# Patient Record
Sex: Female | Born: 1980 | Race: Black or African American | Hispanic: No | Marital: Single | Smoking: Former smoker
Health system: Southern US, Community
[De-identification: ages and names within clinical notes are randomized; demographics above are authoritative.]

## PROBLEM LIST (undated history)

## (undated) DIAGNOSIS — J45909 Unspecified asthma, uncomplicated: Secondary | ICD-10-CM

## (undated) DIAGNOSIS — F419 Anxiety disorder, unspecified: Secondary | ICD-10-CM

## (undated) DIAGNOSIS — M1990 Unspecified osteoarthritis, unspecified site: Secondary | ICD-10-CM

## (undated) DIAGNOSIS — T7840XA Allergy, unspecified, initial encounter: Secondary | ICD-10-CM

## (undated) DIAGNOSIS — F32A Depression, unspecified: Secondary | ICD-10-CM

## (undated) HISTORY — DX: Anxiety disorder, unspecified: F41.9

## (undated) HISTORY — PX: ARM SKIN LESION BIOPSY / EXCISION: SUR471

## (undated) HISTORY — DX: Allergy, unspecified, initial encounter: T78.40XA

## (undated) HISTORY — DX: Depression, unspecified: F32.A

## (undated) HISTORY — DX: Unspecified osteoarthritis, unspecified site: M19.90

---

## 2000-01-26 ENCOUNTER — Emergency Department (HOSPITAL_COMMUNITY): Admission: EM | Admit: 2000-01-26 | Discharge: 2000-01-26 | Payer: Self-pay | Admitting: Emergency Medicine

## 2000-01-26 ENCOUNTER — Encounter: Payer: Self-pay | Admitting: Emergency Medicine

## 2003-10-15 ENCOUNTER — Emergency Department (HOSPITAL_COMMUNITY): Admission: EM | Admit: 2003-10-15 | Discharge: 2003-10-15 | Payer: Self-pay | Admitting: Emergency Medicine

## 2004-07-12 ENCOUNTER — Emergency Department (HOSPITAL_COMMUNITY): Admission: EM | Admit: 2004-07-12 | Discharge: 2004-07-12 | Payer: Self-pay | Admitting: Emergency Medicine

## 2006-06-29 ENCOUNTER — Emergency Department (HOSPITAL_COMMUNITY): Admission: EM | Admit: 2006-06-29 | Discharge: 2006-06-29 | Payer: Self-pay | Admitting: Emergency Medicine

## 2006-08-08 ENCOUNTER — Emergency Department (HOSPITAL_COMMUNITY): Admission: EM | Admit: 2006-08-08 | Discharge: 2006-08-08 | Payer: Self-pay | Admitting: Emergency Medicine

## 2006-08-23 ENCOUNTER — Emergency Department (HOSPITAL_COMMUNITY): Admission: EM | Admit: 2006-08-23 | Discharge: 2006-08-23 | Payer: Self-pay | Admitting: Emergency Medicine

## 2006-10-06 ENCOUNTER — Emergency Department (HOSPITAL_COMMUNITY): Admission: EM | Admit: 2006-10-06 | Discharge: 2006-10-06 | Payer: Self-pay | Admitting: Emergency Medicine

## 2007-07-26 ENCOUNTER — Emergency Department (HOSPITAL_COMMUNITY): Admission: EM | Admit: 2007-07-26 | Discharge: 2007-07-26 | Payer: Self-pay | Admitting: Emergency Medicine

## 2008-10-29 ENCOUNTER — Emergency Department (HOSPITAL_BASED_OUTPATIENT_CLINIC_OR_DEPARTMENT_OTHER): Admission: EM | Admit: 2008-10-29 | Discharge: 2008-10-30 | Payer: Self-pay | Admitting: Emergency Medicine

## 2008-10-29 ENCOUNTER — Ambulatory Visit: Payer: Self-pay | Admitting: Diagnostic Radiology

## 2009-05-21 ENCOUNTER — Emergency Department (HOSPITAL_BASED_OUTPATIENT_CLINIC_OR_DEPARTMENT_OTHER): Admission: EM | Admit: 2009-05-21 | Discharge: 2009-05-21 | Payer: Self-pay | Admitting: Emergency Medicine

## 2010-03-27 ENCOUNTER — Emergency Department (HOSPITAL_BASED_OUTPATIENT_CLINIC_OR_DEPARTMENT_OTHER)
Admission: EM | Admit: 2010-03-27 | Discharge: 2010-03-27 | Disposition: A | Discharge status: Home / Self Care | Payer: No Typology Code available for payment source | Attending: Emergency Medicine | Admitting: Emergency Medicine

## 2010-03-27 DIAGNOSIS — J45909 Unspecified asthma, uncomplicated: Secondary | ICD-10-CM | POA: Insufficient documentation

## 2010-03-27 DIAGNOSIS — F341 Dysthymic disorder: Secondary | ICD-10-CM | POA: Insufficient documentation

## 2010-03-27 DIAGNOSIS — R112 Nausea with vomiting, unspecified: Secondary | ICD-10-CM | POA: Insufficient documentation

## 2010-03-27 DIAGNOSIS — R197 Diarrhea, unspecified: Secondary | ICD-10-CM | POA: Insufficient documentation

## 2010-03-27 DIAGNOSIS — F17200 Nicotine dependence, unspecified, uncomplicated: Secondary | ICD-10-CM | POA: Insufficient documentation

## 2010-03-27 LAB — URINALYSIS, ROUTINE W REFLEX MICROSCOPIC
Bilirubin Urine: NEGATIVE
Hgb urine dipstick: NEGATIVE
Ketones, ur: NEGATIVE mg/dL
Nitrite: NEGATIVE
Protein, ur: NEGATIVE mg/dL
Specific Gravity, Urine: 1.012 (ref 1.005–1.030)
Urine Glucose, Fasting: NEGATIVE mg/dL
Urobilinogen, UA: 1 mg/dL (ref 0.0–1.0)
pH: 7.5 (ref 5.0–8.0)

## 2010-03-27 LAB — BASIC METABOLIC PANEL
BUN: 9 mg/dL (ref 6–23)
CO2: 28 mEq/L (ref 19–32)
Calcium: 8.8 mg/dL (ref 8.4–10.5)
Chloride: 108 mEq/L (ref 96–112)
Creatinine, Ser: 0.6 mg/dL (ref 0.4–1.2)
GFR calc Af Amer: >60 mL/min (ref >60)
GFR calc non Af Amer: >60 mL/min (ref >60)
Glucose, Bld: 84 mg/dL (ref 70–99)
Potassium: 4.2 mEq/L (ref 3.5–5.1)
Sodium: 141 mEq/L (ref 135–145)

## 2010-03-27 LAB — PREGNANCY, URINE: Preg Test, Ur: NEGATIVE

## 2010-04-30 ENCOUNTER — Emergency Department (HOSPITAL_BASED_OUTPATIENT_CLINIC_OR_DEPARTMENT_OTHER)
Admission: EM | Admit: 2010-04-30 | Discharge: 2010-04-30 | Disposition: A | Discharge status: Home / Self Care | Payer: No Typology Code available for payment source | Attending: Emergency Medicine | Admitting: Emergency Medicine

## 2010-04-30 DIAGNOSIS — F17200 Nicotine dependence, unspecified, uncomplicated: Secondary | ICD-10-CM | POA: Insufficient documentation

## 2010-04-30 DIAGNOSIS — J45909 Unspecified asthma, uncomplicated: Secondary | ICD-10-CM | POA: Insufficient documentation

## 2010-04-30 DIAGNOSIS — Z79899 Other long term (current) drug therapy: Secondary | ICD-10-CM | POA: Insufficient documentation

## 2010-04-30 DIAGNOSIS — F341 Dysthymic disorder: Secondary | ICD-10-CM | POA: Insufficient documentation

## 2010-04-30 DIAGNOSIS — R109 Unspecified abdominal pain: Secondary | ICD-10-CM | POA: Insufficient documentation

## 2010-04-30 LAB — URINALYSIS, ROUTINE W REFLEX MICROSCOPIC
Hgb urine dipstick: NEGATIVE
Protein, ur: NEGATIVE mg/dL
Urobilinogen, UA: 0.2 mg/dL (ref 0.0–1.0)

## 2010-04-30 LAB — WET PREP, GENITAL
Clue Cells Wet Prep HPF POC: NONE SEEN
Trich, Wet Prep: NONE SEEN

## 2010-05-01 LAB — GC/CHLAMYDIA PROBE AMP, GENITAL: GC Probe Amp, Genital: NEGATIVE

## 2010-11-25 LAB — BASIC METABOLIC PANEL
BUN: 6
Chloride: 109
GFR calc non Af Amer: >60
Glucose, Bld: 96
Potassium: 3.9

## 2010-11-25 LAB — DIFFERENTIAL
Basophils Absolute: 0.1
Basophils Relative: 2 — ABNORMAL HIGH
Eosinophils Absolute: 0
Eosinophils Relative: 1

## 2010-11-25 LAB — CBC
HCT: 35.9 — ABNORMAL LOW
MCV: 94.9
Platelets: 249
RDW: 13.5

## 2011-03-14 IMAGING — CR DG HAND COMPLETE 3+V*R*
3 series · 3 of 3 positions shown · non-contrast
Comparison: None

CLINICAL DATA: Status post dog bite to right hand, with swelling
and puncture wound between first and second metacarpals.

RIGHT HAND - COMPLETE 3+ VIEW

[x hand pa right]
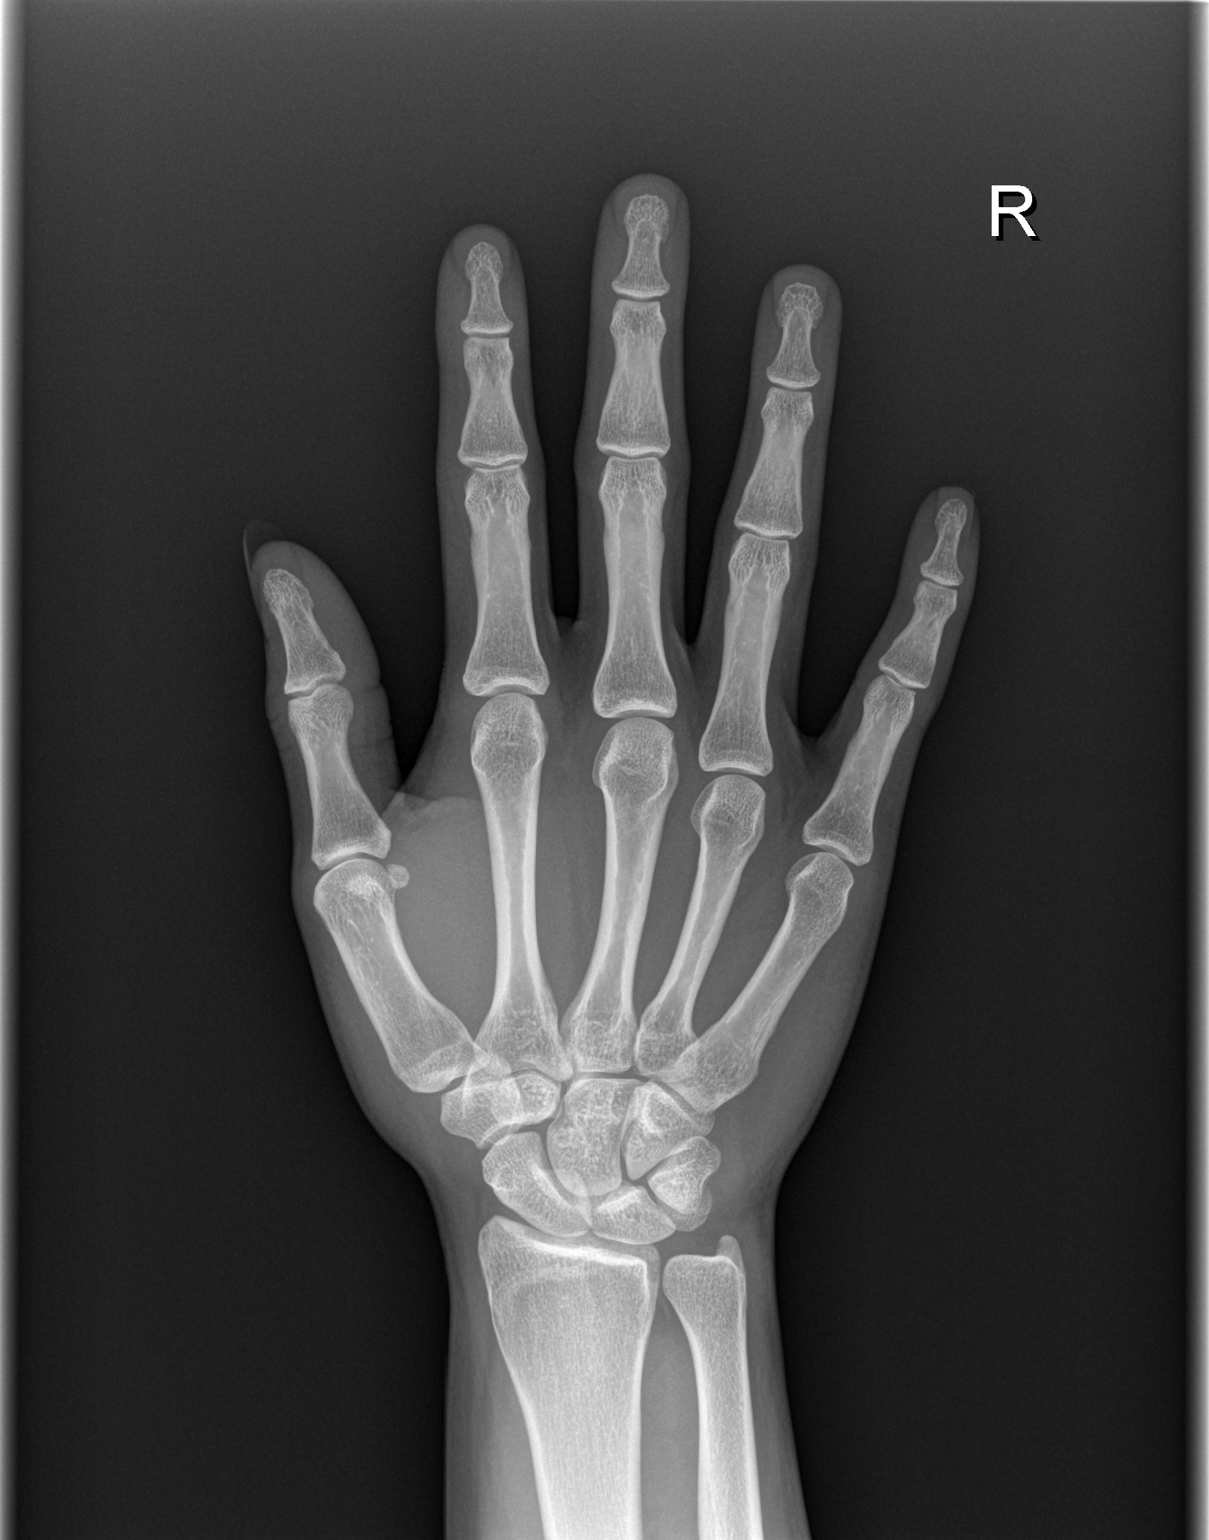

[x hand oblique right]
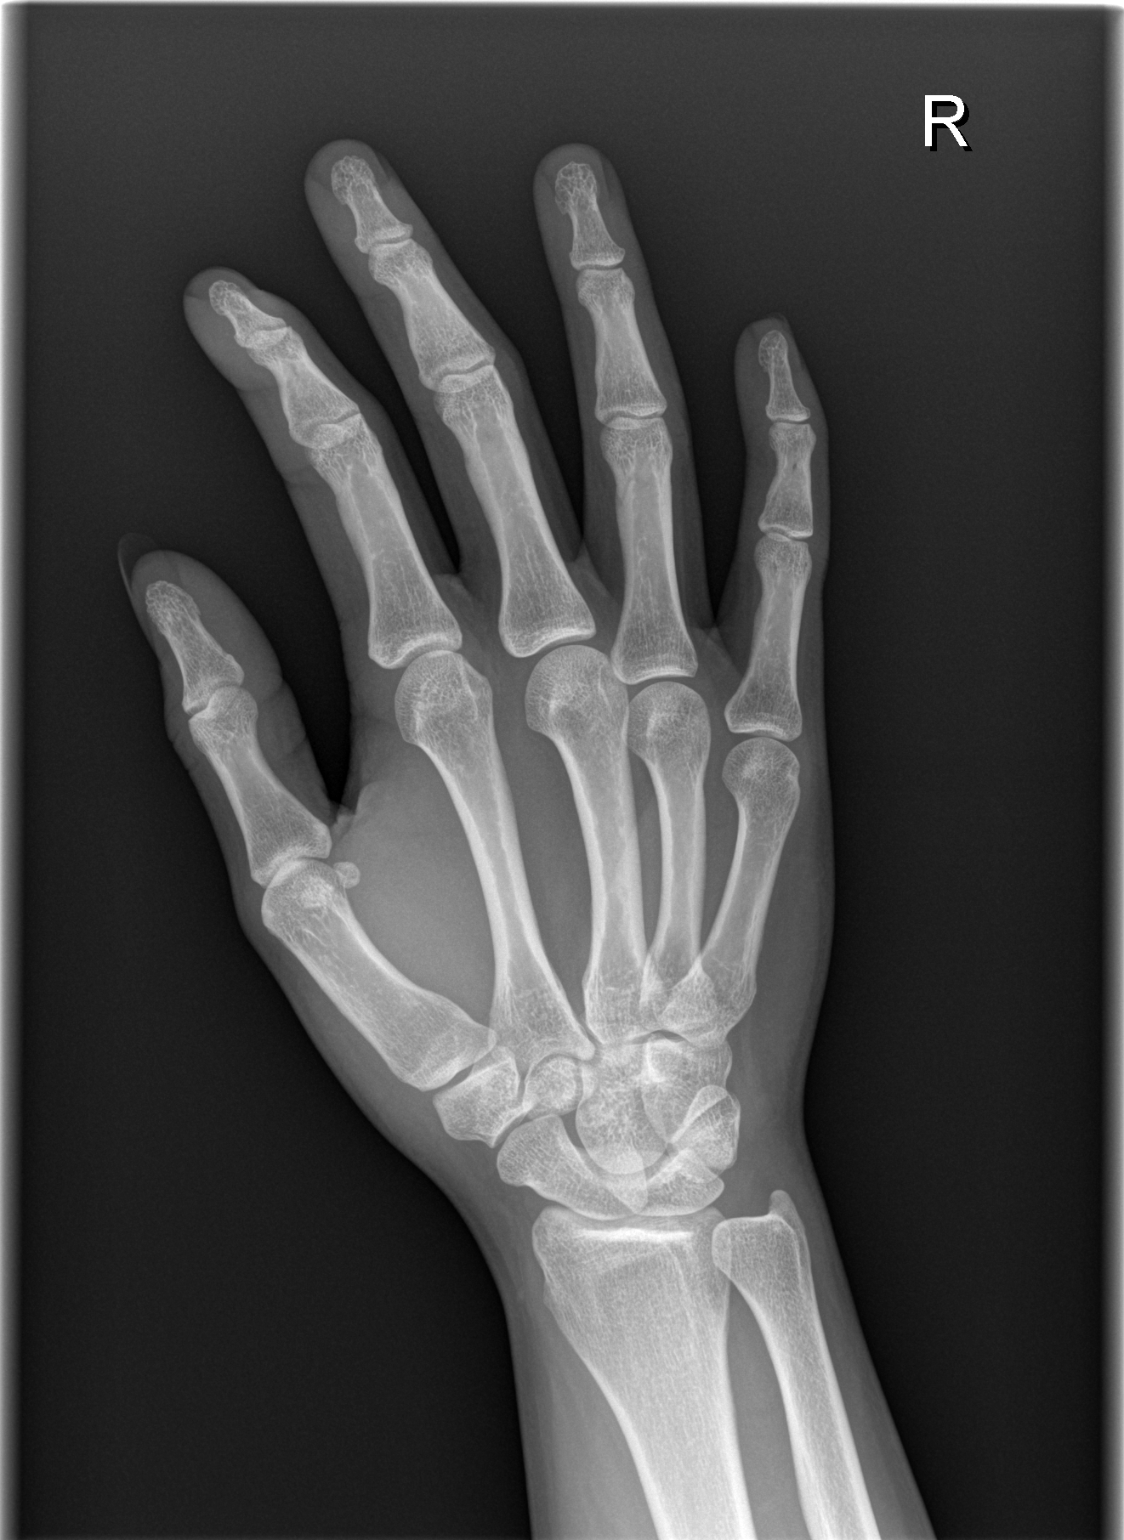

[x hand lat right]
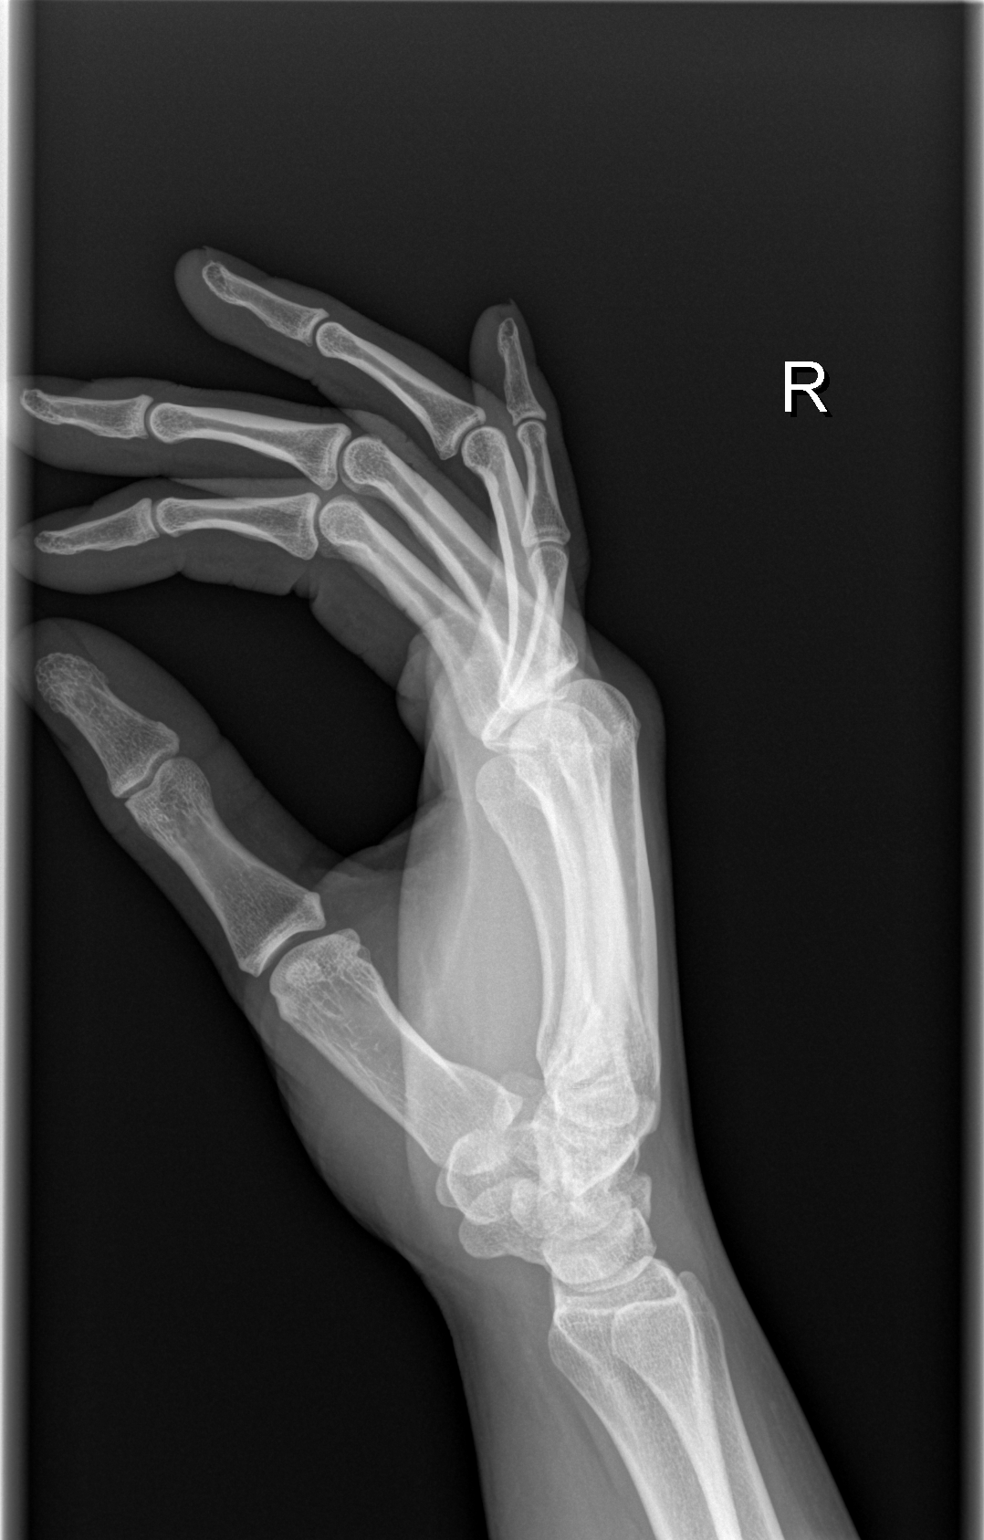

[3 of 3 positions shown; findings below may reference images not displayed]

FINDINGS: There is no evidence of fracture or dislocation.  The
joint spaces are preserved; no definite soft tissue abnormalities
are seen on radiograph.  The carpal rows are intact, and
demonstrate normal alignment.
IMPRESSION: No evidence of fracture or dislocation.

## 2012-01-15 ENCOUNTER — Encounter (HOSPITAL_BASED_OUTPATIENT_CLINIC_OR_DEPARTMENT_OTHER): Payer: Self-pay

## 2012-01-15 ENCOUNTER — Emergency Department (HOSPITAL_BASED_OUTPATIENT_CLINIC_OR_DEPARTMENT_OTHER)
Admission: EM | Admit: 2012-01-15 | Discharge: 2012-01-15 | Disposition: A | Discharge status: Home / Self Care | Payer: Self-pay | Attending: Emergency Medicine | Admitting: Emergency Medicine

## 2012-01-15 DIAGNOSIS — R0982 Postnasal drip: Secondary | ICD-10-CM | POA: Insufficient documentation

## 2012-01-15 DIAGNOSIS — J029 Acute pharyngitis, unspecified: Secondary | ICD-10-CM

## 2012-01-15 DIAGNOSIS — R059 Cough, unspecified: Secondary | ICD-10-CM | POA: Insufficient documentation

## 2012-01-15 DIAGNOSIS — R509 Fever, unspecified: Secondary | ICD-10-CM | POA: Insufficient documentation

## 2012-01-15 DIAGNOSIS — R05 Cough: Secondary | ICD-10-CM | POA: Insufficient documentation

## 2012-01-15 HISTORY — DX: Unspecified asthma, uncomplicated: J45.909

## 2012-01-15 LAB — RAPID STREP SCREEN (MED CTR MEBANE ONLY): Streptococcus, Group A Screen (Direct): NEGATIVE

## 2012-01-15 NOTE — ED Provider Notes (Signed)
History     CSN: [id number]  Arrival date & time 01/15/12  0808   First MD Initiated Contact with Patient 01/15/12 [phone]      No chief complaint on file.   (Consider location/radiation/quality/duration/timing/severity/associated sxs/prior treatment) HPI  Patient complaining of sore throat, fever, chills 8 days ago.  Some sneezing and cough with post nasal drip.  Mucinex, cold and flu alka seltzer treatment at home.  Fever for a day or two.  Today throat hurting worse all over again.  Pain is moderate no treatment today.  No sick contacts.     No past medical history on file.  No past surgical history on file.  No family history on file.  History  Substance Use Topics  . Smoking status: Not on file  . Smokeless tobacco: Not on file  . Alcohol Use: Not on file    OB History    No data available      Review of Systems  Constitutional: Positive for fever.  HENT: Positive for congestion, sneezing and postnasal drip.   Respiratory: Positive for cough. Negative for wheezing.   Cardiovascular: Negative for chest pain and leg swelling.  Gastrointestinal: Negative for vomiting.  Genitourinary: Negative.   Musculoskeletal: Negative.   Neurological: Negative.   Hematological: Negative.   Psychiatric/Behavioral: Negative.     Allergies  Review of patient's allergies indicates not on file.  Home Medications  No current outpatient prescriptions on file.  There were no vitals taken for this visit.  Physical Exam  Nursing note and vitals reviewed. Constitutional: She is oriented to person, place, and time. She appears well-developed and well-nourished.  HENT:  Head: Normocephalic and atraumatic.  Right Ear: External ear normal.  Left Ear: External ear normal.  Nose: Nose normal.       Some punctate white spots left tonsil, no swelling or erythema beyond baseline.   Eyes: Conjunctivae normal and EOM are normal. Pupils are equal, round, and reactive to light.  Neck:  Normal range of motion. Neck supple.  Cardiovascular: Normal rate and regular rhythm.   Pulmonary/Chest: Effort normal and breath sounds normal.  Abdominal: Soft. Bowel sounds are normal.  Musculoskeletal: Normal range of motion.  Neurological: She is alert and oriented to person, place, and time.  Skin: Skin is warm and dry.  Psychiatric: She has a normal mood and affect. Her behavior is normal. Judgment and thought content normal.    ED Course  Procedures (including critical care time)  Labs Reviewed - No data to display No results found.   No diagnosis found.   Results for orders placed during the hospital encounter of 01/15/12  RAPID STREP SCREEN      Component Value Range   Streptococcus, Group A Screen (Direct) NEGATIVE  NEGATIVE    MDM  31 y.o. Female with pharyngitis and uri symptoms.  Strep negative.         Hilario Quarry, MD 01/15/12 0900

## 2012-01-15 NOTE — ED Notes (Signed)
Pt reports cold symptoms, sore throat, fever and chills x 1 week unrelieved after taking OTC medications.

## 2014-02-23 ENCOUNTER — Ambulatory Visit (INDEPENDENT_AMBULATORY_CARE_PROVIDER_SITE_OTHER): Payer: 59 | Admitting: Family Medicine

## 2014-02-23 VITALS — BP 120/64 | HR 66 | Temp 97.3°F | Resp 18 | Ht 67.25 in | Wt 223.4 lb

## 2014-02-23 DIAGNOSIS — J0100 Acute maxillary sinusitis, unspecified: Secondary | ICD-10-CM

## 2014-02-23 MED ORDER — FLUCONAZOLE 150 MG PO TABS: 150.0000 mg | ORAL_TABLET | Freq: Once | ORAL | Status: DC

## 2014-02-23 MED ORDER — AMOXICILLIN-POT CLAVULANATE 875-125 MG PO TABS: 1.0000 | ORAL_TABLET | Freq: Two times a day (BID) | ORAL | Status: DC

## 2014-02-23 NOTE — Patient Instructions (Signed)

## 2014-02-23 NOTE — Progress Notes (Signed)
Patient ID: Brittany Baxter MRN: [id number], DOB: 07/12/1980, 34 y.o. Date of Encounter: 02/23/2014, 11:12 AM  Primary Physician: No PCP Per Patient  Chief Complaint:  Chief Complaint  Patient presents with  . Nasal Congestion    x 1 week  . Facial Pain  . Ear Pain    and fulness    HPI: 34 y.o. year old female presents with 10 day history of nasal congestion, post nasal drip, sore throat, sinus pressure, and cough. Afebrile. No chills. Nasal congestion thick and green/yellow. Sinus pressure is the worst symptom. Cough is productive secondary to post nasal drip and not associated with time of day. Ears feel full, leading to sensation of muffled hearing. Has tried OTC cold preps without success. No GI complaints.   No recent antibiotics, recent travels, vomiting, or sick contacts   No leg trauma, sedentary periods, h/o cancer, or tobacco use.  Past Medical History  Diagnosis Date  . Asthma      Home Meds: Prior to Admission medications   Medication Sig Start Date End Date Taking? Authorizing Provider  Cetirizine HCl (ZYRTEC PO) Take by mouth.   Yes Historical Provider, MD  albuterol (PROVENTIL HFA;VENTOLIN HFA) 108 (90 BASE) MCG/ACT inhaler Inhale 2 puffs into the lungs every 6 (six) hours as needed.    Historical Provider, MD  amoxicillin-clavulanate (AUGMENTIN) 875-125 MG per tablet Take 1 tablet by mouth 2 (two) times daily. 02/23/14   Elvina SidleKurt Tu Shimmel, MD  fluconazole (DIFLUCAN) 150 MG tablet Take 1 tablet (150 mg total) by mouth once. 02/23/14   Elvina SidleKurt Omarius Grantham, MD    Allergies: No Known Allergies  History   Social History  . Marital Status: Single    Spouse Name: N/A    Number of Children: N/A  . Years of Education: N/A   Occupational History  . Not on file.   Social History Main Topics  . Smoking status: Former Smoker -- 0.50 packs/day    Types: Cigarettes  . Smokeless tobacco: Never Used  . Alcohol Use: 0.0 oz/week    0 Not specified per week     Comment:  weekly  . Drug Use: No  . Sexual Activity: Not on file   Other Topics Concern  . Not on file   Social History Narrative     Review of Systems: Constitutional: negative for chills, fever, night sweats or weight changes Cardiovascular: negative for chest pain or palpitations Respiratory: negative for hemoptysis, wheezing, or shortness of breath Abdominal: negative for abdominal pain, nausea, vomiting or diarrhea Dermatological: negative for rash Neurologic: negative for headache   Physical Exam: Blood pressure 120/64, pulse 66, temperature 97.3 F (36.3 C), temperature source Oral, resp. rate 18, height 5' 7.25" (1.708 m), weight 223 lb 6.4 oz (101.334 kg), SpO2 100 %., Body mass index is 34.74 kg/(m^2). General: Well developed, well nourished, in no acute distress. Head: Normocephalic, atraumatic, eyes without discharge, sclera non-icteric, nares are congested. Bilateral auditory canals clear, TM's are without perforation, pearly grey with reflective cone of light bilaterally. Serous effusion bilaterally behind TM's. Maxillary sinus TTP. Oral cavity moist, dentition normal. Posterior pharynx with post nasal drip and mild erythema. No peritonsillar abscess or tonsillar exudate. Neck: Supple. No thyromegaly. Full ROM. No lymphadenopathy. Lungs: Clear bilaterally to auscultation without wheezes, rales, or rhonchi. Breathing is unlabored.  Heart: RRR with S1 S2. No murmurs, rubs, or gallops appreciated. Msk:  Strength and tone normal for age. Extremities: No clubbing or cyanosis. No edema. Neuro: Alert and oriented X  3. Moves all extremities spontaneously. CNII-XII grossly in tact. Psych:  Responds to questions appropriately with a normal affect.      ASSESSMENT AND PLAN:  34 y.o. year old female with sinusitis     ICD-9-CM ICD-10-CM   1. Acute maxillary sinusitis, recurrence not specified 461.0 J01.00 amoxicillin-clavulanate (AUGMENTIN) 875-125 MG per tablet     fluconazole  (DIFLUCAN) 150 MG tablet     Signed, [name], MD  -   ICD-9-CM ICD-10-CM   1. Acute maxillary sinusitis, recurrence not specified 461.0 J01.00 amoxicillin-clavulanate (AUGMENTIN) 875-125 MG per tablet     fluconazole (DIFLUCAN) 150 MG tablet    -Tylenol/Motrin prn -Rest/fluids -RTC precautions -RTC 3-5 days if no improvement  Signed, [name], MD 02/23/2014 11:12 AM

## 2014-03-08 ENCOUNTER — Other Ambulatory Visit: Payer: Self-pay | Admitting: Family Medicine

## 2014-03-10 ENCOUNTER — Ambulatory Visit (INDEPENDENT_AMBULATORY_CARE_PROVIDER_SITE_OTHER): Payer: 59 | Admitting: Family Medicine

## 2014-03-10 VITALS — BP 124/82 | HR 73 | Temp 98.4°F | Resp 18 | Ht 68.25 in | Wt 225.0 lb

## 2014-03-10 DIAGNOSIS — J0100 Acute maxillary sinusitis, unspecified: Secondary | ICD-10-CM

## 2014-03-10 DIAGNOSIS — L739 Follicular disorder, unspecified: Secondary | ICD-10-CM

## 2014-03-10 MED ORDER — LEVOFLOXACIN 500 MG PO TABS: 500.0000 mg | ORAL_TABLET | Freq: Every day | ORAL | Status: DC

## 2014-03-10 NOTE — Patient Instructions (Signed)
Folliculitis  Folliculitis is redness, soreness, and swelling (inflammation) of the hair follicles. This condition can occur anywhere on the body. People with weakened immune systems, diabetes, or obesity have a greater risk of getting folliculitis. CAUSES  Bacterial infection. This is the most common cause.  Fungal infection.  Viral infection.  Contact with certain chemicals, especially oils and tars. Long-term folliculitis can result from bacteria that live in the nostrils. The bacteria may trigger multiple outbreaks of folliculitis over time. SYMPTOMS Folliculitis most commonly occurs on the scalp, thighs, legs, back, buttocks, and areas where hair is shaved frequently. An early sign of folliculitis is a small, white or yellow, pus-filled, itchy lesion (pustule). These lesions appear on a red, inflamed follicle. They are usually less than 0.2 inches (5 mm) wide. When there is an infection of the follicle that goes deeper, it becomes a boil or furuncle. A group of closely packed boils creates a larger lesion (carbuncle). Carbuncles tend to occur in hairy, sweaty areas of the body. DIAGNOSIS  Your caregiver can usually tell what is wrong by doing a physical exam. A sample may be taken from one of the lesions and tested in a lab. This can help determine what is causing your folliculitis. TREATMENT  Treatment may include:  Applying warm compresses to the affected areas.  Taking antibiotic medicines orally or applying them to the skin.  Draining the lesions if they contain a large amount of pus or fluid.  Laser hair removal for cases of long-lasting folliculitis. This helps to prevent regrowth of the hair. HOME CARE INSTRUCTIONS  Apply warm compresses to the affected areas as directed by your caregiver.  If antibiotics are prescribed, take them as directed. Finish them even if you start to feel better.  You may take over-the-counter medicines to relieve itching.  Do not shave  irritated skin.  Follow up with your caregiver as directed. SEEK IMMEDIATE MEDICAL CARE IF:   You have increasing redness, swelling, or pain in the affected area.  You have a fever. MAKE SURE YOU:  Understand these instructions.  Will watch your condition.  Will get help right away if you are not doing well or get worse. Document Released: 04/05/2001 Document Revised: 07/27/2011 Document Reviewed: 04/27/2011 [facility] Patient Information 2015 [employer] [place]. This information is not intended to replace advice given to you by your health care provider. Make sure you discuss any questions you have with your health care provider. Sinusitis Sinusitis is redness, soreness, and inflammation of the paranasal sinuses. Paranasal sinuses are air pockets within the bones of your face (beneath the eyes, the middle of the forehead, or above the eyes). In healthy paranasal sinuses, mucus is able to drain out, and air is able to circulate through them by way of your nose. However, when your paranasal sinuses are inflamed, mucus and air can become trapped. This can allow bacteria and other germs to grow and cause infection. Sinusitis can develop quickly and last only a short time (acute) or continue over a long period (chronic). Sinusitis that lasts for more than 12 weeks is considered chronic.  CAUSES  Causes of sinusitis include:  Allergies.  Structural abnormalities, such as displacement of the cartilage that separates your nostrils (deviated septum), which can decrease the air flow through your nose and sinuses and affect sinus drainage.  Functional abnormalities, such as when the small hairs (cilia) that line your sinuses and help remove mucus do not work properly or are not present. SIGNS AND SYMPTOMS  Symptoms  of acute and chronic sinusitis are the same. The primary symptoms are pain and pressure around the affected sinuses. Other symptoms include:  Upper  toothache.  Earache.  Headache.  Bad breath.  Decreased sense of smell and taste.  A cough, which worsens when you are lying flat.  Fatigue.  Fever.  Thick drainage from your nose, which often is green and may contain pus (purulent).  Swelling and warmth over the affected sinuses. DIAGNOSIS  Your health care provider will perform a physical exam. During the exam, your health care provider may:  Look in your nose for signs of abnormal growths in your nostrils (nasal polyps).  Tap over the affected sinus to check for signs of infection.  View the inside of your sinuses (endoscopy) using an imaging device that has a light attached (endoscope). If your health care provider suspects that you have chronic sinusitis, one or more of the following tests may be recommended:  Allergy tests.  Nasal culture. A sample of mucus is taken from your nose, sent to a lab, and screened for bacteria.  Nasal cytology. A sample of mucus is taken from your nose and examined by your health care provider to determine if your sinusitis is related to an allergy. TREATMENT  Most cases of acute sinusitis are related to a viral infection and will resolve on their own within 10 days. Sometimes medicines are prescribed to help relieve symptoms (pain medicine, decongestants, nasal steroid sprays, or saline sprays).  However, for sinusitis related to a bacterial infection, your health care provider will prescribe antibiotic medicines. These are medicines that will help kill the bacteria causing the infection.  Rarely, sinusitis is caused by a fungal infection. In theses cases, your health care provider will prescribe antifungal medicine. For some cases of chronic sinusitis, surgery is needed. Generally, these are cases in which sinusitis recurs more than 3 times per year, despite other treatments. HOME CARE INSTRUCTIONS   Drink plenty of water. Water helps thin the mucus so your sinuses can drain more  easily.  Use a humidifier.  Inhale steam 3 to 4 times a day (for example, sit in the bathroom with the shower running).  Apply a warm, moist washcloth to your face 3 to 4 times a day, or as directed by your health care provider.  Use saline nasal sprays to help moisten and clean your sinuses.  Take medicines only as directed by your health care provider.  If you were prescribed either an antibiotic or antifungal medicine, finish it all even if you start to feel better. SEEK IMMEDIATE MEDICAL CARE IF:  You have increasing pain or severe headaches.  You have nausea, vomiting, or drowsiness.  You have swelling around your face.  You have vision problems.  You have a stiff neck.  You have difficulty breathing. MAKE SURE YOU:   Understand these instructions.  Will watch your condition.  Will get help right away if you are not doing well or get worse. Document Released: 01/25/2005 Document Revised: 06/11/2013 Document Reviewed: 02/09/2011 [facility] Patient Information 2015 [place], [place]. This information is not intended to replace advice given to you by your health care provider. Make sure you discuss any questions you have with your health care provider.

## 2014-03-10 NOTE — Progress Notes (Signed)
° °  Subjective:    Patient ID: Brittany Baxter, female    DOB: 08/21/1980, 34 y.o.   MRN: [id number] This chart was scribed for Elvina SidleKurt Lauenstein, MD by Littie Deedsichard Sun, Medical Scribe. This patient was seen in Room 3 and the patient's care was started at 1:01 PM.   HPI HPI Comments: Brittany Baxter is a 34 y.o. female who presents to the [facility] for a follow-up for sinusitis. Patient was recently seen on 02/23/14 by me. She states her symptoms have improved, but not completely. She still has bad HA, rhinorrhea in the morning, and productive cough of green phlegm. Her cough has resolved. She also reports having some difficulty sleeping on some nights.  Patient reports having some ingrown hairs that are painful that she believes to be due to a side-effect of the abx. She has had ingrown hairs several years ago that she had lanced.  Patient works at [employer].  Note from 02/23/14: 34 y.o. year old female presents with 10 day history of nasal congestion, post nasal drip, sore throat, sinus pressure, and cough. Afebrile. No chills. Nasal congestion thick and green/yellow. Sinus pressure is the worst symptom. Cough is productive secondary to post nasal drip and not associated with time of day. Ears feel full, leading to sensation of muffled hearing. Has tried OTC cold preps without success. No GI complaints.  No recent antibiotics, recent travels, vomiting, or sick contacts  No leg trauma, sedentary periods, h/o cancer, or tobacco use.   Review of Systems  HENT: Positive for congestion, rhinorrhea and sinus pressure.   Respiratory: Negative for cough.   Neurological: Positive for headaches.       Objective:   Physical Exam CONSTITUTIONAL: Well developed/well nourished HEAD: Normocephalic/atraumatic EYES: EOM/PERRL ENMT: Mucous membranes moist, mucopurulent discharge both nasal passages with swelling, normal oropharynx, normal TMs NECK: supple no meningeal signs SPINE: entire  spine nontender CV: S1/S2 noted, no murmurs/rubs/gallops noted LUNGS: Lungs are clear to auscultation bilaterally, no apparent distress ABDOMEN: soft, nontender, no rebound or guarding GU: no cva tenderness NEURO: Pt is awake/alert, moves all extremitiesx4 EXTREMITIES: pulses normal, full ROM SKIN: warm, several red papules in pubic area PSYCH: no abnormalities of mood noted        Assessment & Plan:   This chart was scribed in my presence and reviewed by me personally.    ICD-9-CM ICD-10-CM   1. Subacute maxillary sinusitis 461.0 J01.00 levofloxacin (LEVAQUIN) 500 MG tablet  2. Folliculitis 704.8 L73.9 levofloxacin (LEVAQUIN) 500 MG tablet     Signed, Elvina SidleKurt Lauenstein, MD

## 2014-04-10 ENCOUNTER — Other Ambulatory Visit: Payer: Self-pay | Admitting: Family Medicine

## 2014-04-10 ENCOUNTER — Telehealth: Payer: Self-pay

## 2014-04-10 DIAGNOSIS — J0100 Acute maxillary sinusitis, unspecified: Secondary | ICD-10-CM

## 2014-04-10 DIAGNOSIS — L739 Follicular disorder, unspecified: Secondary | ICD-10-CM

## 2014-04-10 MED ORDER — LEVOFLOXACIN 500 MG PO TABS: 500.0000 mg | ORAL_TABLET | Freq: Every day | ORAL | Status: DC

## 2014-04-10 NOTE — Telephone Encounter (Signed)
Spoke with pt, advised pt to come in to see you at 4pm. Pt states she was told by Dr Milus GlazierLauenstein to call him if she had any problems. Please advise. Last OV and Plan:  1. Subacute maxillary sinusitis 461.0 J01.00 levofloxacin (LEVAQUIN) 500 MG tablet  2. Folliculitis 704.8 L73.9 levofloxacin (LEVAQUIN) 500 MG tablet

## 2014-04-10 NOTE — Telephone Encounter (Signed)
Patient would like to speak with Dr. Milus GlazierLauenstein. She is feeling sick and needs advising. Please call! [phone]

## 2014-04-19 ENCOUNTER — Ambulatory Visit (INDEPENDENT_AMBULATORY_CARE_PROVIDER_SITE_OTHER): Payer: 59 | Admitting: Family Medicine

## 2014-04-19 VITALS — BP 112/68 | HR 99 | Temp 98.8°F | Ht 68.0 in | Wt 230.4 lb

## 2014-04-19 DIAGNOSIS — R042 Hemoptysis: Secondary | ICD-10-CM

## 2014-04-19 DIAGNOSIS — J0100 Acute maxillary sinusitis, unspecified: Secondary | ICD-10-CM | POA: Diagnosis not present

## 2014-04-19 MED ORDER — AMOXICILLIN-POT CLAVULANATE 875-125 MG PO TABS: 1.0000 | ORAL_TABLET | Freq: Two times a day (BID) | ORAL | Status: DC

## 2014-04-19 MED ORDER — FLUCONAZOLE 150 MG PO TABS: 150.0000 mg | ORAL_TABLET | Freq: Once | ORAL | Status: DC

## 2014-04-19 NOTE — Progress Notes (Signed)
° °  Subjective:    Patient ID: Brittany Baxter, female    DOB: 04/19/1980, 34 y.o.   MRN: [id number]  This chart was scribed for Brittany SidleKurt Lauenstein, MD, by Ronney LionSuzanne Le, ED Scribe. This patient was seen in room  and the patient's care was started at 4:16 PM.   HPI   Chief Complaint  Patient presents with   Sore Throat    Patient states that right side is worse than left. Patient states that she has been coughing, coughing up phlegm. Patient states that she was given antibiotics but, has not had any improvement.    HPI Comments: Brittany BilisSabrina Baxter is a 34 y.o. female who presents to the [facility] complaining of a constant, moderate sore throat, right greater than left. She also presents picture of thick, bloody phlegm that she had to force out by coughing yesterday. She complains of associated chills and diaphoresis today. She was seen by me 03/10/14 and treated for sinusitis with Levaquin, with no relief. She also reports taking Zyrtec with no relief. She denies a history of smoking.    Patient [place] at [employer] [place]  Review of Systems  Constitutional: Positive for fever and chills.  HENT: Positive for congestion, sinus pressure and sore throat.        Objective:   Physical Exam  Constitutional: She is oriented to person, place, and time. She appears well-developed and well-nourished. No distress.  HENT:  Head: Normocephalic and atraumatic.  Oropharynx swollen and mildly erythematous. Swollen nasal passages.  Eyes: Conjunctivae and EOM are normal.  Neck: Neck supple. No tracheal deviation present.  Cardiovascular: Normal rate.   Pulmonary/Chest: Effort normal. No respiratory distress.  Musculoskeletal: Normal range of motion.  Neurological: She is alert and oriented to person, place, and time.  Skin: Skin is warm and dry.  Psychiatric: She has a normal mood and affect. Her behavior is normal.  Nursing note and vitals reviewed.       Assessment & Plan:    This chart was scribed in my presence and reviewed by me personally.    ICD-9-CM ICD-10-CM   1. Acute maxillary sinusitis, recurrence not specified 461.0 J01.00 amoxicillin-clavulanate (AUGMENTIN) 875-125 MG per tablet     Ambulatory referral to ENT  2. Hemoptysis 786.30 R04.2 Ambulatory referral to ENT     Signed, Brittany SidleKurt Lauenstein, MD

## 2014-04-19 NOTE — Patient Instructions (Signed)

## 2014-04-21 ENCOUNTER — Telehealth: Payer: Self-pay | Admitting: Family Medicine

## 2014-04-21 NOTE — Telephone Encounter (Signed)
Patient states that she was seen on 04/19/2014 and was prescribed amoxacillin. She states that her throat has gotten worse; that it is swelling more and red. She also states that looked down her throat and there are white spots. She thinks she may have strep throat. Patient is requesting a call back before she returns to clinic.   [phone]

## 2014-04-21 NOTE — Telephone Encounter (Signed)
The medicine is very effective for most bacteria, including strep, that affect the throat.  Please continue the antibiotic at least until Wednesday

## 2014-04-21 NOTE — Telephone Encounter (Signed)
Spoke with patient and she thinks her throat pain has slightly improved and the "spots" are new. She noticed those today. She wanted to know if she had strep if Augmentin would cover? She would also [facility]. Please advise.

## 2014-04-23 NOTE — Telephone Encounter (Signed)
lmom to cb. 

## 2014-04-29 NOTE — Telephone Encounter (Signed)
Left detailed msg on pts machine, call back if needed

## 2014-11-09 ENCOUNTER — Ambulatory Visit (INDEPENDENT_AMBULATORY_CARE_PROVIDER_SITE_OTHER): Payer: 59 | Admitting: Family Medicine

## 2014-11-09 VITALS — BP 104/70 | HR 105 | Temp 98.1°F | Resp 18 | Ht 68.0 in | Wt 232.4 lb

## 2014-11-09 DIAGNOSIS — R6884 Jaw pain: Secondary | ICD-10-CM | POA: Diagnosis not present

## 2014-11-09 DIAGNOSIS — K029 Dental caries, unspecified: Secondary | ICD-10-CM

## 2014-11-09 DIAGNOSIS — R220 Localized swelling, mass and lump, head: Secondary | ICD-10-CM | POA: Diagnosis not present

## 2014-11-09 LAB — POCT CBC
GRANULOCYTE PERCENT: 60.8 % (ref 37–80)
HCT, POC: 36.4 % — AB (ref 37.7–47.9)
Hemoglobin: 11.4 g/dL — AB (ref 12.2–16.2)
Lymph, poc: 2.7 (ref 0.6–3.4)
MCH: 30.3 pg (ref 27–31.2)
MCHC: 31.4 g/dL — AB (ref 31.8–35.4)
MCV: 96.3 fL (ref 80–97)
MID (CBC): 0.1 (ref 0–0.9)
MPV: 6.7 fL (ref 0–99.8)
PLATELET COUNT, POC: 267 10*3/uL (ref 142–424)
POC Granulocyte: 4.4 (ref 2–6.9)
POC LYMPH %: 37.5 % (ref 10–50)
POC MID %: 1.7 %M (ref 0–12)
RBC: 3.78 M/uL — AB (ref 4.04–5.48)
RDW, POC: 15.5 %
WBC: 7.2 10*3/uL (ref 4.6–10.2)

## 2014-11-09 MED ORDER — AMOXICILLIN-POT CLAVULANATE 875-125 MG PO TABS: 1.0000 | ORAL_TABLET | Freq: Two times a day (BID) | ORAL | Status: DC

## 2014-11-09 MED ORDER — TRAMADOL HCL 50 MG PO TABS: 50.0000 mg | ORAL_TABLET | Freq: Four times a day (QID) | ORAL | Status: DC | PRN

## 2014-11-09 NOTE — Patient Instructions (Addendum)
Your swelling and area of soreness may be due to infection from broken tooth.  I will start you on Augmentin (antibiotic), ibuprofen over the counter (no more than  every 8 hours with food), and tramadol if needed for breakthrough pain. Cold compresses to area, and follow up with dentist on Monday. As we discussed, you are sore over the actual jaw bone as well, so if persistent pain in that area without improvement - would recommend other imaging including possible CT scan of jaw. This can be discussed with your dentist as well.   Call any dentist for evaluation Monday.  Here is a referral number if needed: Dental referral center: [phone]  Return to the clinic or go to the nearest emergency room if any of your symptoms worsen or new symptoms occur.  Dental Caries Dental caries (also called tooth decay) is the most common oral disease. It can occur at any age but is more common in children and young adults.  HOW DENTAL CARIES DEVELOPS  The process of decay begins when bacteria and foods (particularly sugars and starches) combine in your mouth to produce plaque. Plaque is a substance that sticks to the hard, outer surface of a tooth (enamel). The bacteria in plaque produce acids that attack enamel. These acids may also attack the root surface of a tooth (cementum) if it is exposed. Repeated attacks dissolve these surfaces and create holes in the tooth (cavities). If left untreated, the acids destroy the other layers of the tooth.  RISK FACTORS  Frequent sipping of sugary beverages.   Frequent snacking on sugary and starchy foods, especially those that easily get stuck in the teeth.   Poor oral hygiene.   Dry mouth.   Substance abuse such as methamphetamine abuse.   Broken or poor-fitting dental restorations.   Eating disorders.   Gastroesophageal reflux disease (GERD).   Certain radiation treatments to the head and neck. SYMPTOMS In the early stages of dental caries,  symptoms are seldom present. Sometimes white, chalky areas may be seen on the enamel or other tooth layers. In later stages, symptoms may include:  Pits and holes on the enamel.  Toothache after sweet, hot, or cold foods or drinks are consumed.  Pain around the tooth.  Swelling around the tooth. DIAGNOSIS  Most of the time, dental caries is detected during a regular dental checkup. A diagnosis is made after a thorough medical and dental history is taken and the surfaces of your teeth are checked for signs of dental caries. Sometimes special instruments, such as lasers, are used to check for dental caries. Dental X-ray exams may be taken so that areas not visible to the eye (such as between the contact areas of the teeth) can be checked for cavities.  TREATMENT  If dental caries is in its early stages, it may be reversed with a fluoride treatment or an application of a remineralizing agent at the dental office. Thorough brushing and flossing at home is needed to aid these treatments. If it is in its later stages, treatment depends on the location and extent of tooth destruction:   If a small area of the tooth has been destroyed, the destroyed area will be removed and cavities will be filled with a material such as gold, silver amalgam, or composite resin.   If a large area of the tooth has been destroyed, the destroyed area will be removed and a cap (crown) will be fitted over the remaining tooth structure.   If the center  part of the tooth (pulp) is affected, a procedure called a root canal will be needed before a filling or crown can be placed.   If most of the tooth has been destroyed, the tooth may need to be pulled (extracted). HOME CARE INSTRUCTIONS You can prevent, stop, or reverse dental caries at home by practicing good oral hygiene. Good oral hygiene includes:  Thoroughly cleaning your teeth at least twice a day with a toothbrush and dental floss.   Using a fluoride toothpaste.  A fluoride mouth rinse may also be used if recommended by your dentist or health care provider.   Restricting the amount of sugary and starchy foods and sugary liquids you consume.   Avoiding frequent snacking on these foods and sipping of these liquids.   Keeping regular visits with a dentist for checkups and cleanings. PREVENTION   Practice good oral hygiene.  Consider a dental sealant. A dental sealant is a coating material that is applied by your dentist to the pits and grooves of teeth. The sealant prevents food from being trapped in them. It may protect the teeth for several years.  Ask about fluoride supplements if you live in a community without fluorinated water or with water that has a low fluoride content. Use fluoride supplements as directed by your dentist or health care provider.  Allow fluoride varnish applications to teeth if directed by your dentist or health care provider. Document Released: 10/17/2001 Document Revised: 06/11/2013 Document Reviewed: 01/28/2012 [facility] Patient Information 2015 [place], [place]. This information is not intended to replace advice given to you by your health care provider. Make sure you discuss any questions you have with your health care provider.   Dental Pain A tooth ache may be caused by cavities (tooth decay). Cavities expose the nerve of the tooth to air and hot or cold temperatures. It may come from an infection or abscess (also called a boil or furuncle) around your tooth. It is also often caused by dental caries (tooth decay). This causes the pain you are having. DIAGNOSIS  Your caregiver can diagnose this problem by exam. TREATMENT   If caused by an infection, it may be treated with medications which kill germs (antibiotics) and pain medications as prescribed by your caregiver. Take medications as directed.  Only take over-the-counter or prescription medicines for pain, discomfort, or fever as directed by your  caregiver.  Whether the tooth ache today is caused by infection or dental disease, you should see your dentist as soon as possible for further care. SEEK MEDICAL CARE IF: The exam and treatment you received today has been provided on an emergency basis only. This is not a substitute for complete medical or dental care. If your problem worsens or new problems (symptoms) appear, and you are unable to meet with your dentist, call or return to this location. SEEK IMMEDIATE MEDICAL CARE IF:   You have a fever.  You develop redness and swelling of your face, jaw, or neck.  You are unable to open your mouth.  You have severe pain uncontrolled by pain medicine. MAKE SURE YOU:   Understand these instructions.  Will watch your condition.  Will get help right away if you are not doing well or get worse. Document Released: 01/25/2005 Document Revised: 04/19/2011 Document Reviewed: 09/13/2007 [facility] Patient Information 2015 [place], [place]. This information is not intended to replace advice given to you by your health care provider. Make sure you discuss any questions you have with your health care provider.

## 2014-11-09 NOTE — Progress Notes (Addendum)
Subjective:    Patient ID: Brittany Baxter, female    DOB: 1980/07/06, 34 y.o.   MRN: [id number] This chart was scribed for Meredith Staggers, MD by Littie Deeds, Medical Scribe. This patient was seen in Room 8 and the patient's care was started at 1:13 PM.   HPI HPI Comments: Brittany Baxter is a 34 y.o. female who presents to the [facility] complaining of right lower facial swelling that started about 2 days ago. Patient reports having associated soreness to her chin and jaw. She has also had some diaphoresis at night. She notes that one of her right lower teeth broke off a few months ago, although she has not had any pain or cold/hot sensitivity to the tooth recently. She has been taking ibuprofen 800 mg every 4-6 hours for the pain. Patient denies fever. She also denies taking any controlled medications recently. She just got dental insurance today.   There are no active problems to display for this patient.  Past Medical History  Diagnosis Date  . Asthma    Past Surgical History  Procedure Laterality Date  . Arm skin lesion biopsy / excision     No Known Allergies Prior to Admission medications   Medication Sig Start Date End Date Taking? Authorizing Provider  Cetirizine HCl (ZYRTEC PO) Take by mouth.   Yes Historical Provider, MD  albuterol (PROVENTIL HFA;VENTOLIN HFA) 108 (90 BASE) MCG/ACT inhaler Inhale 2 puffs into the lungs every 6 (six) hours as needed.    Historical Provider, MD  amoxicillin-clavulanate (AUGMENTIN) 875-125 MG per tablet Take 1 tablet by mouth 2 (two) times daily. Patient not taking: Reported on 11/09/2014 04/19/14   [name], MD  fluconazole (DIFLUCAN) 150 MG tablet Take 1 tablet (150 mg total) by mouth once. Patient not taking: Reported on 11/09/2014 04/19/14   [name], MD   Social History   Social History  . Marital Status: Single    Spouse Name: N/A  . Number of Children: N/A  . Years of Education: N/A   Occupational  History  . Not on file.   Social History Main Topics  . Smoking status: Former Smoker -- 0.50 packs/day    Types: Cigarettes  . Smokeless tobacco: Never Used  . Alcohol Use: 0.0 oz/week    0 Standard drinks or equivalent per week     Comment: weekly  . Drug Use: No  . Sexual Activity: Not on file   Other Topics Concern  . Not on file   Social History Narrative     Review of Systems  Constitutional: Positive for diaphoresis. Negative for fever.  HENT: Positive for dental problem and facial swelling.   Musculoskeletal: Positive for arthralgias.       Objective:   Physical Exam  Constitutional: She is oriented to person, place, and time. She appears well-developed and well-nourished. No distress.  HENT:  Head: Normocephalic and atraumatic.  Nose: Right sinus exhibits no maxillary sinus tenderness and no frontal sinus tenderness. Left sinus exhibits no maxillary sinus tenderness and no frontal sinus tenderness.  Mouth/Throat: Oropharynx is clear and moist. No oropharyngeal exudate.  Moist oral mucosa. Parotid non-tender. Tenderness along right jawline into right submandibular gland, submental area, as well as primarily into the mentum. Right lower 1st molar, there is a filling that appears to be intact, but the lingual side of the tooth has decay of approximately 1/4-1/3 of the tooth; no apparent gum swelling. Canine on right lower side has decay on the  posterior aspect; no apparent gum erythema or abscess. Minimal tenderness when tapping on the canine.  Eyes: Pupils are equal, round, and reactive to light.  Neck: Neck supple.  No apparent cervical lymphadenopathy.  Cardiovascular: Normal rate.   Pulmonary/Chest: Effort normal.  Musculoskeletal: She exhibits no edema.  Lymphadenopathy:    She has no cervical adenopathy.  Neurological: She is alert and oriented to person, place, and time. No cranial nerve deficit.  Skin: Skin is warm and dry. No rash noted.  Psychiatric: She  has a normal mood and affect. Her behavior is normal.  Nursing note and vitals reviewed.  Filed Vitals:   11/09/14 1219  BP: 104/70  Pulse: 105  Temp: 98.1 F (36.7 C)  TempSrc: Oral  Resp: 18  Height:  (1.727 m)  Weight: 232 lb 6 oz (105.405 kg)  SpO2: 99%    Results for orders placed or performed in visit on 11/09/14  POCT CBC  Result Value Ref Range   WBC 7.2 4.6 - 10.2 K/uL   Lymph, poc 2.7 0.6 - 3.4   POC LYMPH PERCENT 37.5 10 - 50 %L   MID (cbc) 0.1 0 - 0.9   POC MID % 1.7 0 - 12 %M   POC Granulocyte 4.4 2 - 6.9   Granulocyte percent 60.8 37 - 80 %G   RBC 3.78 (A) 4.04 - 5.48 M/uL   Hemoglobin 11.4 (A) 12.2 - 16.2 g/dL   HCT, POC 16.1 (A) 09.6 - 47.9 %   MCV 96.3 80 - 97 fL   MCH, POC 30.3 27 - 31.2 pg   MCHC 31.4 (A) 31.8 - 35.4 g/dL   RDW, POC 04.5 %   Platelet Count, POC 267 142 - 424 K/uL   MPV 6.7 0 - 99.8 fL      Assessment & Plan:   Brittany Baxter is a 34 y.o. female Jaw pain - Plan: POCT CBC, amoxicillin-clavulanate (AUGMENTIN) 875-125 MG tablet, traMADol (ULTRAM) 50 MG tablet  Dental decay - Plan: POCT CBC, amoxicillin-clavulanate (AUGMENTIN) 875-125 MG tablet  Swelling of right side of face - Plan: amoxicillin-clavulanate (AUGMENTIN) 875-125 MG tablet  Multiple caries on right lower side, but no apparent periodontal abscess. Acute onset of swelling on the right side of face and right jawline likely due to the underlying decay, but discussed concern of possible deep abscess or bony involvement. Reassuring CBC, afebrile, so we'll start with Augmentin twice a day, ibuprofen up to 800 mg every 8 hours (do not exceed this dosing). Tramadol prescription given for breakthrough pain. Advised to call dentist first thing Monday to be seen. RTC/ER precautions  Meds ordered this encounter  Medications  . amoxicillin-clavulanate (AUGMENTIN) 875-125 MG tablet    Sig: Take 1 tablet by mouth 2 (two) times daily.    Dispense:  20 tablet    Refill:  0  .  traMADol (ULTRAM) 50 MG tablet    Sig: Take 1 tablet (50 mg total) by mouth every 6 (six) hours as needed.    Dispense:  20 tablet    Refill:  0   Patient Instructions  Your swelling and area of soreness may be due to infection from broken tooth.  I will start you on Augmentin (antibiotic), ibuprofen over the counter (no more than  every 8 hours with food), and tramadol if needed for breakthrough pain. Cold compresses to area, and follow up with dentist on Monday. As we discussed, you are sore over the actual jaw bone as well, so  if persistent pain in that area without improvement - would recommend other imaging including possible CT scan of jaw. This can be discussed with your dentist as well.   Call any dentist for evaluation Monday.  Here is a referral number if needed: Dental referral center: [phone]  Return to the clinic or go to the nearest emergency room if any of your symptoms worsen or new symptoms occur.  Dental Caries Dental caries (also called tooth decay) is the most common oral disease. It can occur at any age but is more common in children and young adults.  HOW DENTAL CARIES DEVELOPS  The process of decay begins when bacteria and foods (particularly sugars and starches) combine in your mouth to produce plaque. Plaque is a substance that sticks to the hard, outer surface of a tooth (enamel). The bacteria in plaque produce acids that attack enamel. These acids may also attack the root surface of a tooth (cementum) if it is exposed. Repeated attacks dissolve these surfaces and create holes in the tooth (cavities). If left untreated, the acids destroy the other layers of the tooth.  RISK FACTORS  Frequent sipping of sugary beverages.   Frequent snacking on sugary and starchy foods, especially those that easily get stuck in the teeth.   Poor oral hygiene.   Dry mouth.   Substance abuse such as methamphetamine abuse.   Broken or poor-fitting dental  restorations.   Eating disorders.   Gastroesophageal reflux disease (GERD).   Certain radiation treatments to the head and neck. SYMPTOMS In the early stages of dental caries, symptoms are seldom present. Sometimes white, chalky areas may be seen on the enamel or other tooth layers. In later stages, symptoms may include:  Pits and holes on the enamel.  Toothache after sweet, hot, or cold foods or drinks are consumed.  Pain around the tooth.  Swelling around the tooth. DIAGNOSIS  Most of the time, dental caries is detected during a regular dental checkup. A diagnosis is made after a thorough medical and dental history is taken and the surfaces of your teeth are checked for signs of dental caries. Sometimes special instruments, such as lasers, are used to check for dental caries. Dental X-ray exams may be taken so that areas not visible to the eye (such as between the contact areas of the teeth) can be checked for cavities.  TREATMENT  If dental caries is in its early stages, it may be reversed with a fluoride treatment or an application of a remineralizing agent at the dental office. Thorough brushing and flossing at home is needed to aid these treatments. If it is in its later stages, treatment depends on the location and extent of tooth destruction:   If a small area of the tooth has been destroyed, the destroyed area will be removed and cavities will be filled with a material such as gold, silver amalgam, or composite resin.   If a large area of the tooth has been destroyed, the destroyed area will be removed and a cap (crown) will be fitted over the remaining tooth structure.   If the center part of the tooth (pulp) is affected, a procedure called a root canal will be needed before a filling or crown can be placed.   If most of the tooth has been destroyed, the tooth may need to be pulled (extracted). HOME CARE INSTRUCTIONS You can prevent, stop, or reverse dental caries at  home by practicing good oral hygiene. Good oral hygiene includes:  Thoroughly  cleaning your teeth at least twice a day with a toothbrush and dental floss.   Using a fluoride toothpaste. A fluoride mouth rinse may also be used if recommended by your dentist or health care provider.   Restricting the amount of sugary and starchy foods and sugary liquids you consume.   Avoiding frequent snacking on these foods and sipping of these liquids.   Keeping regular visits with a dentist for checkups and cleanings. PREVENTION   Practice good oral hygiene.  Consider a dental sealant. A dental sealant is a coating material that is applied by your dentist to the pits and grooves of teeth. The sealant prevents food from being trapped in them. It may protect the teeth for several years.  Ask about fluoride supplements if you live in a community without fluorinated water or with water that has a low fluoride content. Use fluoride supplements as directed by your dentist or health care provider.  Allow fluoride varnish applications to teeth if directed by your dentist or health care provider. Document Released: 10/17/2001 Document Revised: 06/11/2013 Document Reviewed: 01/28/2012 [facility] Patient Information 2015 [place], [place]. This information is not intended to replace advice given to you by your health care provider. Make sure you discuss any questions you have with your health care provider.   Dental Pain A tooth ache may be caused by cavities (tooth decay). Cavities expose the nerve of the tooth to air and hot or cold temperatures. It may come from an infection or abscess (also called a boil or furuncle) around your tooth. It is also often caused by dental caries (tooth decay). This causes the pain you are having. DIAGNOSIS  Your caregiver can diagnose this problem by exam. TREATMENT   If caused by an infection, it may be treated with medications which kill germs (antibiotics) and pain  medications as prescribed by your caregiver. Take medications as directed.  Only take over-the-counter or prescription medicines for pain, discomfort, or fever as directed by your caregiver.  Whether the tooth ache today is caused by infection or dental disease, you should see your dentist as soon as possible for further care. SEEK MEDICAL CARE IF: The exam and treatment you received today has been provided on an emergency basis only. This is not a substitute for complete medical or dental care. If your problem worsens or new problems (symptoms) appear, and you are unable to meet with your dentist, call or return to this location. SEEK IMMEDIATE MEDICAL CARE IF:   You have a fever.  You develop redness and swelling of your face, jaw, or neck.  You are unable to open your mouth.  You have severe pain uncontrolled by pain medicine. MAKE SURE YOU:   Understand these instructions.  Will watch your condition.  Will get help right away if you are not doing well or get worse. Document Released: 01/25/2005 Document Revised: 04/19/2011 Document Reviewed: 09/13/2007 [facility] Patient Information 2015 [place], [place]. This information is not intended to replace advice given to you by your health care provider. Make sure you discuss any questions you have with your health care provider.     I personally performed the services described in this documentation, which was scribed in my presence. The recorded information has been reviewed and considered, and addended by me as needed.    By signing my name below, I, Littie Deeds, attest that this documentation has been prepared under the direction and in the presence of Meredith Staggers, MD.  Electronically Signed: Littie Deeds, Medical  Scribe. 11/09/2014. 1:13 PM.

## 2019-11-13 ENCOUNTER — Other Ambulatory Visit: Payer: No Typology Code available for payment source

## 2019-11-13 DIAGNOSIS — Z20822 Contact with and (suspected) exposure to covid-19: Secondary | ICD-10-CM

## 2019-11-14 LAB — SARS-COV-2, NAA 2 DAY TAT

## 2019-11-14 LAB — NOVEL CORONAVIRUS, NAA: SARS-CoV-2, NAA: NOT DETECTED

## 2021-07-20 ENCOUNTER — Ambulatory Visit: Payer: No Typology Code available for payment source | Admitting: Family Medicine

## 2021-08-17 ENCOUNTER — Ambulatory Visit (INDEPENDENT_AMBULATORY_CARE_PROVIDER_SITE_OTHER): Payer: No Typology Code available for payment source | Admitting: Family Medicine

## 2021-08-17 ENCOUNTER — Encounter: Payer: Self-pay | Admitting: Family Medicine

## 2021-08-17 VITALS — BP 110/68 | HR 69 | Temp 98.1°F | Ht 67.5 in | Wt 221.2 lb

## 2021-08-17 DIAGNOSIS — Z1159 Encounter for screening for other viral diseases: Secondary | ICD-10-CM

## 2021-08-17 DIAGNOSIS — Z0000 Encounter for general adult medical examination without abnormal findings: Secondary | ICD-10-CM

## 2021-08-17 DIAGNOSIS — Z130 Encounter for screening for diseases of the blood and blood-forming organs and certain disorders involving the immune mechanism: Secondary | ICD-10-CM

## 2021-08-17 DIAGNOSIS — Z114 Encounter for screening for human immunodeficiency virus [HIV]: Secondary | ICD-10-CM

## 2021-08-17 DIAGNOSIS — Z1231 Encounter for screening mammogram for malignant neoplasm of breast: Secondary | ICD-10-CM

## 2021-08-17 DIAGNOSIS — Z13220 Encounter for screening for lipoid disorders: Secondary | ICD-10-CM

## 2021-08-17 DIAGNOSIS — Z7689 Persons encountering health services in other specified circumstances: Secondary | ICD-10-CM

## 2021-08-17 MED ORDER — ALBUTEROL SULFATE HFA 108 (90 BASE) MCG/ACT IN AERS: 2.0000 | INHALATION_SPRAY | Freq: Four times a day (QID) | RESPIRATORY_TRACT | 3 refills | Status: DC | PRN

## 2021-08-17 NOTE — Progress Notes (Unsigned)
Patient is here to established care. Patient has no other concerns today 

## 2021-08-18 ENCOUNTER — Encounter: Payer: Self-pay | Admitting: Family Medicine

## 2021-08-18 ENCOUNTER — Other Ambulatory Visit: Payer: Self-pay | Admitting: Family Medicine

## 2021-08-18 LAB — VITAMIN D 25 HYDROXY (VIT D DEFICIENCY, FRACTURES): Vit D, 25-Hydroxy: 19.6 ng/mL — ABNORMAL LOW (ref 30.0–100.0)

## 2021-08-18 LAB — CBC WITH DIFFERENTIAL/PLATELET
Basophils Absolute: 0 10*3/uL (ref 0.0–0.2)
Basos: 1 %
EOS (ABSOLUTE): 0.2 10*3/uL (ref 0.0–0.4)
Eos: 3 %
Hematocrit: 41.1 % (ref 34.0–46.6)
Hemoglobin: 13.8 g/dL (ref 11.1–15.9)
Immature Grans (Abs): 0 10*3/uL (ref 0.0–0.1)
Immature Granulocytes: 0 %
Lymphocytes Absolute: 2.3 10*3/uL (ref 0.7–3.1)
Lymphs: 34 %
MCH: 32.7 pg (ref 26.6–33.0)
MCHC: 33.6 g/dL (ref 31.5–35.7)
MCV: 97 fL (ref 79–97)
Monocytes Absolute: 0.6 10*3/uL (ref 0.1–0.9)
Monocytes: 9 %
Neutrophils Absolute: 3.6 10*3/uL (ref 1.4–7.0)
Neutrophils: 53 %
Platelets: 239 10*3/uL (ref 150–450)
RBC: 4.22 x10E6/uL (ref 3.77–5.28)
RDW: 11.6 % — ABNORMAL LOW (ref 11.7–15.4)
WBC: 6.8 10*3/uL (ref 3.4–10.8)

## 2021-08-18 LAB — LIPID PANEL
Chol/HDL Ratio: 2.9 ratio (ref 0.0–4.4)
Cholesterol, Total: 142 mg/dL (ref 100–199)
HDL: 49 mg/dL (ref >39)
LDL Chol Calc (NIH): 77 mg/dL (ref 0–99)
Triglycerides: 81 mg/dL (ref 0–149)
VLDL Cholesterol Cal: 16 mg/dL (ref 5–40)

## 2021-08-18 LAB — CMP14+EGFR
ALT: 21 IU/L (ref 0–32)
AST: 16 IU/L (ref 0–40)
Albumin/Globulin Ratio: 1.5 (ref 1.2–2.2)
Albumin: 4.1 g/dL (ref 3.9–4.9)
Alkaline Phosphatase: 62 IU/L (ref 44–121)
BUN/Creatinine Ratio: 17 (ref 9–23)
BUN: 12 mg/dL (ref 6–24)
Bilirubin Total: 0.3 mg/dL (ref 0.0–1.2)
CO2: 22 mmol/L (ref 20–29)
Calcium: 8.8 mg/dL (ref 8.7–10.2)
Chloride: 106 mmol/L (ref 96–106)
Creatinine, Ser: 0.7 mg/dL (ref 0.57–1.00)
Globulin, Total: 2.8 g/dL (ref 1.5–4.5)
Glucose: 88 mg/dL (ref 70–99)
Potassium: 4.9 mmol/L (ref 3.5–5.2)
Sodium: 139 mmol/L (ref 134–144)
Total Protein: 6.9 g/dL (ref 6.0–8.5)
eGFR: 111 mL/min/{1.73_m2} (ref >59)

## 2021-08-18 LAB — HEMOGLOBIN A1C
Est. average glucose Bld gHb Est-mCnc: 105 mg/dL
Hgb A1c MFr Bld: 5.3 % (ref 4.8–5.6)

## 2021-08-18 LAB — HEPATITIS C ANTIBODY: Hep C Virus Ab: NONREACTIVE

## 2021-08-18 LAB — TSH: TSH: 1.56 u[IU]/mL (ref 0.450–4.500)

## 2021-08-18 MED ORDER — VITAMIN D (ERGOCALCIFEROL) 1.25 MG (50000 UNIT) PO CAPS: 50000.0000 [IU] | ORAL_CAPSULE | ORAL | 2 refills | Status: DC

## 2021-08-18 NOTE — Progress Notes (Signed)
New Patient Office Visit  Subjective    Patient ID: Brittany Baxter, female    DOB: [birth year]  Age: 41 y.o. MRN: [id number]  CC:  Chief Complaint  Patient presents with   Annual Exam    HPI Loa Idler presents to establish care and for routine annual exam. Patient denies acute complaints or concerns.    Outpatient Encounter Medications as of 08/17/2021  Medication Sig   albuterol (VENTOLIN HFA) 108 (90 Base) MCG/ACT inhaler Inhale 2 puffs into the lungs every 6 (six) hours as needed.   buPROPion (WELLBUTRIN) 100 MG tablet    Cetirizine HCl (ZYRTEC PO) Take by mouth.   sertraline (ZOLOFT) 100 MG tablet Take 100 mg by mouth daily.   [DISCONTINUED] albuterol (PROVENTIL HFA;VENTOLIN HFA) 108 (90 BASE) MCG/ACT inhaler Inhale 2 puffs into the lungs every 6 (six) hours as needed.   [DISCONTINUED] amoxicillin-clavulanate (AUGMENTIN) 875-125 MG tablet Take 1 tablet by mouth 2 (two) times daily.   [DISCONTINUED] traMADol (ULTRAM) 50 MG tablet Take 1 tablet (50 mg total) by mouth every 6 (six) hours as needed.   No facility-administered encounter medications on file as of 08/17/2021.    Past Medical History:  Diagnosis Date   Allergy    Anxiety    Arthritis    Asthma    Depression     Past Surgical History:  Procedure Laterality Date   ARM SKIN LESION BIOPSY / EXCISION      Family History  Problem Relation Age of Onset   Cancer Mother    Diabetes Brother     Social History   Socioeconomic History   Marital status: Single    Spouse name: Not on file   Number of children: Not on file   Years of education: Not on file   Highest education level: Not on file  Occupational History   Not on file  Tobacco Use   Smoking status: Former    Packs/day: 0.50    Types: Cigarettes    Quit date: 05/26/2014    Years since quitting: 7.2   Smokeless tobacco: Never  Substance and Sexual Activity   Alcohol use: Yes    Comment: weekly   Drug use: Yes    Types: Marijuana    Sexual activity: Yes  Other Topics Concern   Not on file  Social History Narrative   Not on file   Social Determinants of Health   Financial Resource Strain: Not on file  Food Insecurity: Not on file  Transportation Needs: Not on file  Physical Activity: Not on file  Stress: Not on file  Social Connections: Not on file  Intimate Partner Violence: Not on file    Review of Systems  All other systems reviewed and are negative.       Objective    BP 110/68   Pulse 69   Temp 98.1 F (36.7 C) (Oral)   Ht 5' 7.5" (1.715 m)   Wt 221 lb 3.2 oz (100.3 kg)   SpO2 96%   BMI 34.13 kg/m   Physical Exam Vitals and nursing note reviewed.  Constitutional:      General: She is not in acute distress. HENT:     Head: Normocephalic and atraumatic.     Right Ear: Tympanic membrane, ear canal and external ear normal.     Left Ear: Tympanic membrane, ear canal and external ear normal.     Nose: Nose normal.     Mouth/Throat:     Mouth: Mucous membranes are  moist.     Pharynx: Oropharynx is clear.  Eyes:     Conjunctiva/sclera: Conjunctivae normal.     Pupils: Pupils are equal, round, and reactive to light.  Neck:     Thyroid: No thyromegaly.  Cardiovascular:     Rate and Rhythm: Normal rate and regular rhythm.     Heart sounds: Normal heart sounds. No murmur heard. Pulmonary:     Effort: Pulmonary effort is normal. No respiratory distress.     Breath sounds: Normal breath sounds.  Abdominal:     General: There is no distension.     Palpations: Abdomen is soft. There is no mass.     Tenderness: There is no abdominal tenderness.  Musculoskeletal:        General: Normal range of motion.     Cervical back: Normal range of motion and neck supple.  Skin:    General: Skin is warm and dry.  Neurological:     General: No focal deficit present.     Mental Status: She is alert and oriented to person, place, and time.  Psychiatric:        Mood and Affect: Mood normal.         Behavior: Behavior normal.         Assessment & Plan:   1. Annual physical exam  - CMP14+EGFR  2. Screening for deficiency anemia  - CBC with Differential  3. Screening for lipid disorders  - Lipid Panel  4. Screening for endocrine/metabolic/immunity disorders  - Vitamin D, 25-hydroxy - Hemoglobin A1c - TSH  5. Encounter for screening mammogram for malignant neoplasm of breast  - MM Digital Screening; Future  6. Screening for HIV (human immunodeficiency virus)   7. Need for hepatitis C screening test  - Hepatitis C Antibody  8. Encounter to establish care     No follow-ups on file.   Becky Sax, MD

## 2021-08-18 NOTE — Telephone Encounter (Signed)
Please advise patient.  

## 2021-08-28 ENCOUNTER — Ambulatory Visit
Admission: RE | Admit: 2021-08-28 | Discharge: 2021-08-28 | Disposition: A | Discharge status: Home / Self Care | Payer: No Typology Code available for payment source | Source: Ambulatory Visit | Attending: Family Medicine | Admitting: Family Medicine

## 2021-08-28 DIAGNOSIS — Z1231 Encounter for screening mammogram for malignant neoplasm of breast: Secondary | ICD-10-CM

## 2021-09-01 ENCOUNTER — Other Ambulatory Visit: Payer: Self-pay | Admitting: Family Medicine

## 2021-09-01 DIAGNOSIS — R928 Other abnormal and inconclusive findings on diagnostic imaging of breast: Secondary | ICD-10-CM

## 2021-09-08 ENCOUNTER — Ambulatory Visit
Admission: RE | Admit: 2021-09-08 | Discharge: 2021-09-08 | Disposition: A | Discharge status: Home / Self Care | Payer: No Typology Code available for payment source | Source: Ambulatory Visit | Attending: Family Medicine | Admitting: Family Medicine

## 2021-09-08 DIAGNOSIS — R928 Other abnormal and inconclusive findings on diagnostic imaging of breast: Secondary | ICD-10-CM

## 2021-11-05 ENCOUNTER — Other Ambulatory Visit: Payer: Self-pay | Admitting: Family Medicine

## 2021-11-05 NOTE — Telephone Encounter (Signed)
Requested medications are due for refill today.  yes  Requested medications are on the active medications list.  yes  Last refill. 08/18/2021 #4 2 rf  Future visit scheduled.   no  Notes to clinic.  Refill not delegated.    Requested Prescriptions  Pending Prescriptions Disp Refills   Vitamin D, Ergocalciferol, (DRISDOL) 1.25 MG (50000 UNIT) CAPS capsule [Pharmacy Med Name: VITAMIN D2 50,000IU (ERGO) CAP RX] 4 capsule 2    Sig: TAKE 1 CAPSULE BY MOUTH EVERY 7 DAYS     Endocrinology:  Vitamins - Vitamin D Supplementation 2 Failed - 11/05/2021  3:31 AM      Failed - Manual Review: Route requests for 50,000 IU strength to the provider      Failed - Vitamin D in normal range and within 360 days    Vit D, 25-Hydroxy  Date Value Ref Range Status  08/17/2021 19.6 (L) 30.0 - 100.0 ng/mL Final    Comment:    Vitamin D deficiency has been defined by the Institute of Medicine and an Endocrine Society practice guideline as a level of serum 25-OH vitamin D less than 20 ng/mL (1,2). The Endocrine Society went on to further define vitamin D insufficiency as a level between 21 and 29 ng/mL (2). 1. IOM (Institute of Medicine). 2010. Dietary reference    intakes for calcium and D. [place]: The    [employer]. 2. Holick MF, Binkley , Bischoff-Ferrari HA, et al.    Evaluation, treatment, and prevention of vitamin D    deficiency: an Endocrine Society clinical practice    guideline. JCEM. 2011 Jul; 96(7):1911-30.          Passed - Ca in normal range and within 360 days    Calcium  Date Value Ref Range Status  08/17/2021 8.8 8.7 - 10.2 mg/dL Final         Passed - Valid encounter within last 12 months    Recent Outpatient Visits           2 months ago Annual physical exam   [facility], MD   6 years ago Jaw pain   [facility], Ranell Patrick, MD   7 years ago Acute maxillary sinusitis, recurrence not specified    [facility], MD   7 years ago Subacute maxillary sinusitis   [facility], Synetta Shadow, MD   7 years ago Acute maxillary sinusitis, recurrence not specified   [facility], MD

## 2021-11-24 ENCOUNTER — Other Ambulatory Visit: Payer: Self-pay | Admitting: Family Medicine

## 2022-07-28 ENCOUNTER — Telehealth: Payer: Self-pay | Admitting: Family Medicine

## 2022-07-28 NOTE — Telephone Encounter (Signed)
Called pt and left a vm to call office back to schedule physical

## 2022-09-28 ENCOUNTER — Other Ambulatory Visit (HOSPITAL_COMMUNITY)
Admission: RE | Admit: 2022-09-28 | Discharge: 2022-09-28 | Disposition: A | Discharge status: Home / Self Care | Payer: No Typology Code available for payment source | Source: Ambulatory Visit | Attending: Family Medicine | Admitting: Family Medicine

## 2022-09-28 ENCOUNTER — Ambulatory Visit: Payer: No Typology Code available for payment source | Admitting: Family Medicine

## 2022-09-28 ENCOUNTER — Encounter: Payer: Self-pay | Admitting: Family Medicine

## 2022-09-28 ENCOUNTER — Encounter: Payer: No Typology Code available for payment source | Admitting: Family Medicine

## 2022-09-28 VITALS — BP 122/80 | HR 68 | Temp 98.1°F | Resp 16 | Ht 68.0 in | Wt 228.0 lb

## 2022-09-28 DIAGNOSIS — F419 Anxiety disorder, unspecified: Secondary | ICD-10-CM | POA: Insufficient documentation

## 2022-09-28 DIAGNOSIS — Z0000 Encounter for general adult medical examination without abnormal findings: Secondary | ICD-10-CM | POA: Diagnosis present

## 2022-09-28 DIAGNOSIS — Z13220 Encounter for screening for lipoid disorders: Secondary | ICD-10-CM

## 2022-09-28 DIAGNOSIS — Z01419 Encounter for gynecological examination (general) (routine) without abnormal findings: Secondary | ICD-10-CM | POA: Diagnosis present

## 2022-09-28 DIAGNOSIS — F4310 Post-traumatic stress disorder, unspecified: Secondary | ICD-10-CM | POA: Insufficient documentation

## 2022-09-28 DIAGNOSIS — Z114 Encounter for screening for human immunodeficiency virus [HIV]: Secondary | ICD-10-CM

## 2022-09-28 DIAGNOSIS — Z130 Encounter for screening for diseases of the blood and blood-forming organs and certain disorders involving the immune mechanism: Secondary | ICD-10-CM

## 2022-09-28 NOTE — Progress Notes (Unsigned)
-  Patient is here to have annually  complete physical examination  -Care gap address -labs taken  

## 2022-09-29 ENCOUNTER — Ambulatory Visit: Payer: No Typology Code available for payment source

## 2022-09-29 ENCOUNTER — Encounter: Payer: Self-pay | Admitting: Family Medicine

## 2022-09-29 DIAGNOSIS — Z20822 Contact with and (suspected) exposure to covid-19: Secondary | ICD-10-CM

## 2022-09-29 LAB — VITAMIN D 25 HYDROXY (VIT D DEFICIENCY, FRACTURES): Vit D, 25-Hydroxy: 21.1 ng/mL — ABNORMAL LOW (ref 30.0–100.0)

## 2022-09-29 LAB — CERVICOVAGINAL ANCILLARY ONLY
Bacterial Vaginitis (gardnerella): NEGATIVE
Candida Glabrata: NEGATIVE
Candida Vaginitis: NEGATIVE
Chlamydia: NEGATIVE
Comment: NEGATIVE
Comment: NEGATIVE
Comment: NEGATIVE
Comment: NEGATIVE
Comment: NEGATIVE
Comment: NORMAL
Neisseria Gonorrhea: NEGATIVE
Trichomonas: NEGATIVE

## 2022-09-29 LAB — CBC WITH DIFFERENTIAL/PLATELET
Basophils Absolute: 0 10*3/uL (ref 0.0–0.2)
Basos: 1 %
EOS (ABSOLUTE): 0.2 10*3/uL (ref 0.0–0.4)
Eos: 3 %
Hematocrit: 37.9 % (ref 34.0–46.6)
Hemoglobin: 12.8 g/dL (ref 11.1–15.9)
Immature Grans (Abs): 0 10*3/uL (ref 0.0–0.1)
Immature Granulocytes: 0 %
Lymphocytes Absolute: 2 10*3/uL (ref 0.7–3.1)
Lymphs: 40 %
MCH: 33.3 pg — ABNORMAL HIGH (ref 26.6–33.0)
MCHC: 33.8 g/dL (ref 31.5–35.7)
MCV: 99 fL — ABNORMAL HIGH (ref 79–97)
Monocytes Absolute: 0.4 10*3/uL (ref 0.1–0.9)
Monocytes: 9 %
Neutrophils Absolute: 2.2 10*3/uL (ref 1.4–7.0)
Neutrophils: 47 %
Platelets: 269 10*3/uL (ref 150–450)
RBC: 3.84 x10E6/uL (ref 3.77–5.28)
RDW: 12.4 % (ref 11.7–15.4)
WBC: 4.8 10*3/uL (ref 3.4–10.8)

## 2022-09-29 LAB — LIPID PANEL
Chol/HDL Ratio: 3.2 ratio (ref 0.0–4.4)
Cholesterol, Total: 169 mg/dL (ref 100–199)
HDL: 53 mg/dL (ref >39)
LDL Chol Calc (NIH): 94 mg/dL (ref 0–99)
Triglycerides: 126 mg/dL (ref 0–149)
VLDL Cholesterol Cal: 22 mg/dL (ref 5–40)

## 2022-09-29 LAB — HIV ANTIBODY (ROUTINE TESTING W REFLEX): HIV Screen 4th Generation wRfx: NONREACTIVE

## 2022-09-29 LAB — CMP14+EGFR
ALT: 18 IU/L (ref 0–32)
AST: 16 IU/L (ref 0–40)
Albumin: 4.2 g/dL (ref 3.9–4.9)
Alkaline Phosphatase: 57 IU/L (ref 44–121)
BUN/Creatinine Ratio: 14 (ref 9–23)
BUN: 10 mg/dL (ref 6–24)
Bilirubin Total: 0.4 mg/dL (ref 0.0–1.2)
CO2: 21 mmol/L (ref 20–29)
Calcium: 8.7 mg/dL (ref 8.7–10.2)
Chloride: 106 mmol/L (ref 96–106)
Creatinine, Ser: 0.71 mg/dL (ref 0.57–1.00)
Globulin, Total: 2.7 g/dL (ref 1.5–4.5)
Glucose: 98 mg/dL (ref 70–99)
Potassium: 4.2 mmol/L (ref 3.5–5.2)
Sodium: 139 mmol/L (ref 134–144)
Total Protein: 6.9 g/dL (ref 6.0–8.5)
eGFR: 109 mL/min/{1.73_m2} (ref >59)

## 2022-09-29 LAB — TSH: TSH: 1.8 u[IU]/mL (ref 0.450–4.500)

## 2022-09-29 MED ORDER — VITAMIN D (ERGOCALCIFEROL) 1.25 MG (50000 UNIT) PO CAPS: 50000.0000 [IU] | ORAL_CAPSULE | ORAL | 0 refills | Status: DC

## 2022-09-29 NOTE — Progress Notes (Signed)
Patient has been expose to covid and request COVID swab

## 2022-09-29 NOTE — Progress Notes (Signed)
Established Patient Office Visit  Subjective    Patient ID: Brittany Baxter, female    DOB: 09/01/1980  Age: 42 y.o. MRN: [id number]  CC:  Chief Complaint  Patient presents with   Annual Exam    HPI Brittany Baxter presents for routine annual exam. Patient denies acute complaints.    Outpatient Encounter Medications as of 09/28/2022  Medication Sig   albuterol (VENTOLIN HFA) 108 (90 Base) MCG/ACT inhaler INHALE 2 PUFFS INTO THE LUNGS EVERY 6 HOURS AS NEEDED   buPROPion (WELLBUTRIN) 100 MG tablet    Cetirizine HCl (ZYRTEC PO) Take by mouth.   sertraline (ZOLOFT) 100 MG tablet Take 100 mg by mouth daily.   [DISCONTINUED] Vitamin D, Ergocalciferol, (DRISDOL) 1.25 MG (50000 UNIT) CAPS capsule Take 1 capsule (50,000 Units total) by mouth every 7 (seven) days.   No facility-administered encounter medications on file as of 09/28/2022.    Past Medical History:  Diagnosis Date   Allergy    Anxiety    Arthritis    Asthma    Depression     Past Surgical History:  Procedure Laterality Date   ARM SKIN LESION BIOPSY / EXCISION      Family History  Problem Relation Age of Onset   Cancer Mother    Diabetes Brother     Social History   Socioeconomic History   Marital status: Single    Spouse name: Not on file   Number of children: Not on file   Years of education: Not on file   Highest education level: Some college, no degree  Occupational History   Not on file  Tobacco Use   Smoking status: Former    Current packs/day: 0.00    Types: Cigarettes    Quit date: 05/26/2014    Years since quitting: 8.3   Smokeless tobacco: Never  Substance and Sexual Activity   Alcohol use: Yes    Comment: weekly   Drug use: Yes    Types: Marijuana   Sexual activity: Yes  Other Topics Concern   Not on file  Social History Narrative   Not on file   Social Determinants of Health   Financial Resource Strain: Medium Risk (09/27/2022)   Overall Financial Resource Strain (CARDIA)     Difficulty of Paying Living Expenses: Somewhat hard  Food Insecurity: Patient Declined (09/27/2022)   Hunger Vital Sign    Worried About Running Out of Food in the Last Year: Patient declined    Ran Out of Food in the Last Year: Patient declined  Transportation Needs: No Transportation Needs (09/27/2022)   PRAPARE - Administrator, Civil Service (Medical): No    Lack of Transportation (Non-Medical): No  Physical Activity: Insufficiently Active (09/27/2022)   Exercise Vital Sign    Days of Exercise per Week: 2 days    Minutes of Exercise per Session: 30 min  Stress: Stress Concern Present (09/27/2022)   [employer] of Occupational Health - Occupational Stress Questionnaire    Feeling of Stress : To some extent  Social Connections: Unknown (09/27/2022)   Social Connection and Isolation Panel [NHANES]    Frequency of Communication with Friends and Family: More than three times a week    Frequency of Social Gatherings with Friends and Family: Once a week    Attends Religious Services: Patient declined    Database administrator or Organizations: Yes    Attends Banker Meetings: 1 to 4 times per year    Marital Status:  Never married  Intimate Partner Violence: Not on file    Review of Systems  All other systems reviewed and are negative.       Objective    BP 122/80   Pulse 68   Temp 98.1 F (36.7 C) (Oral)   Resp 16   Ht 5\' 8"  (1.727 m)   Wt 228 lb (103.4 kg)   SpO2 97%   BMI 34.67 kg/m   Physical Exam Vitals and nursing note reviewed.  Constitutional:      General: She is not in acute distress. HENT:     Head: Normocephalic and atraumatic.     Right Ear: Tympanic membrane, ear canal and external ear normal.     Left Ear: Tympanic membrane, ear canal and external ear normal.     Nose: Nose normal.     Mouth/Throat:     Mouth: Mucous membranes are moist.     Pharynx: Oropharynx is clear.  Eyes:     Conjunctiva/sclera: Conjunctivae normal.      Pupils: Pupils are equal, round, and reactive to light.  Neck:     Thyroid: No thyromegaly.  Cardiovascular:     Rate and Rhythm: Normal rate and regular rhythm.     Heart sounds: Normal heart sounds. No murmur heard. Pulmonary:     Effort: Pulmonary effort is normal. No respiratory distress.     Breath sounds: Normal breath sounds.  Abdominal:     General: There is no distension.     Palpations: Abdomen is soft. There is no mass.     Tenderness: There is no abdominal tenderness.     Hernia: There is no hernia in the left inguinal area or right inguinal area.  Genitourinary:    Exam position: Supine.     Labia:        Right: Lesion (chronic benign) present.      Vagina: Normal.     Cervix: Normal.     Uterus: Normal.      Adnexa: Right adnexa normal.     Rectum: Normal.  Musculoskeletal:        General: Normal range of motion.     Cervical back: Normal range of motion and neck supple.  Skin:    General: Skin is warm and dry.  Neurological:     General: No focal deficit present.     Mental Status: She is alert and oriented to person, place, and time.  Psychiatric:        Mood and Affect: Mood normal.        Behavior: Behavior normal.         Assessment & Plan:   1. Annual physical exam  - CMP14+EGFR - Cytology - PAP  2. Screening for deficiency anemia  - CBC with Differential  3. Screening for lipid disorders  - Lipid Panel  4. Screening for endocrine/metabolic/immunity disorders  - TSH - Vitamin D, 25-hydroxy  5. Screening for HIV (human immunodeficiency virus)  - HIV antibody (with reflex)  6. Pap smear, as part of routine gynecological examination  - Cervicovaginal ancillary only  No follow-ups on file.   Tommie Raymond, MD

## 2022-09-30 LAB — CYTOLOGY - PAP
Comment: NEGATIVE
Diagnosis: NEGATIVE
High risk HPV: NEGATIVE

## 2022-10-01 LAB — COVID-19, FLU A+B AND RSV
Influenza A, NAA: NOT DETECTED
Influenza B, NAA: NOT DETECTED
RSV, NAA: NOT DETECTED
SARS-CoV-2, NAA: NOT DETECTED

## 2022-11-19 ENCOUNTER — Ambulatory Visit (INDEPENDENT_AMBULATORY_CARE_PROVIDER_SITE_OTHER): Payer: No Typology Code available for payment source | Admitting: Family Medicine

## 2022-11-19 VITALS — BP 118/84 | HR 71 | Temp 98.7°F | Resp 16 | Ht 68.0 in | Wt 227.4 lb

## 2022-11-19 DIAGNOSIS — L989 Disorder of the skin and subcutaneous tissue, unspecified: Secondary | ICD-10-CM | POA: Diagnosis not present

## 2022-11-23 ENCOUNTER — Encounter: Payer: Self-pay | Admitting: Family Medicine

## 2022-11-23 NOTE — Progress Notes (Signed)
Established Patient Office Visit  Subjective    Patient ID: Brittany Baxter, female    DOB: 08-Jan-1981  Age: 42 y.o. MRN: [id number]  CC:  Chief Complaint  Patient presents with   knot  on head    HPI Brittany Baxter presents with complaint of a knot on her scalp. She denies known trauma or injury.   Outpatient Encounter Medications as of 11/19/2022  Medication Sig   albuterol (VENTOLIN HFA) 108 (90 Base) MCG/ACT inhaler INHALE 2 PUFFS INTO THE LUNGS EVERY 6 HOURS AS NEEDED   buPROPion (WELLBUTRIN) 100 MG tablet    Cetirizine HCl (ZYRTEC PO) Take by mouth.   sertraline (ZOLOFT) 100 MG tablet Take 100 mg by mouth daily.   Vitamin D, Ergocalciferol, (DRISDOL) 1.25 MG (50000 UNIT) CAPS capsule Take 1 capsule (50,000 Units total) by mouth every 7 (seven) days.   No facility-administered encounter medications on file as of 11/19/2022.    Past Medical History:  Diagnosis Date   Allergy    Anxiety    Arthritis    Asthma    Depression     Past Surgical History:  Procedure Laterality Date   ARM SKIN LESION BIOPSY / EXCISION      Family History  Problem Relation Age of Onset   Cancer Mother    Diabetes Brother     Social History   Socioeconomic History   Marital status: Single    Spouse name: Not on file   Number of children: Not on file   Years of education: Not on file   Highest education level: Some college, no degree  Occupational History   Not on file  Tobacco Use   Smoking status: Former    Current packs/day: 0.00    Types: Cigarettes    Quit date: 05/26/2014    Years since quitting: 8.5   Smokeless tobacco: Never  Substance and Sexual Activity   Alcohol use: Yes    Comment: weekly   Drug use: Yes    Types: Marijuana   Sexual activity: Yes  Other Topics Concern   Not on file  Social History Narrative   Not on file   Social Determinants of Health   Financial Resource Strain: Patient Declined (11/19/2022)   Overall Financial Resource Strain  (CARDIA)    Difficulty of Paying Living Expenses: Patient declined  Recent Concern: Financial Resource Strain - Medium Risk (09/27/2022)   Overall Financial Resource Strain (CARDIA)    Difficulty of Paying Living Expenses: Somewhat hard  Food Insecurity: Patient Declined (11/19/2022)   Hunger Vital Sign    Worried About Running Out of Food in the Last Year: Patient declined    Ran Out of Food in the Last Year: Patient declined  Transportation Needs: No Transportation Needs (11/19/2022)   PRAPARE - Administrator, Civil Service (Medical): No    Lack of Transportation (Non-Medical): No  Physical Activity: Insufficiently Active (11/19/2022)   Exercise Vital Sign    Days of Exercise per Week: 2 days    Minutes of Exercise per Session: 30 min  Stress: Stress Concern Present (11/19/2022)   [employer] of Occupational Health - Occupational Stress Questionnaire    Feeling of Stress : To some extent  Social Connections: Moderately Integrated (11/19/2022)   Social Connection and Isolation Panel [NHANES]    Frequency of Communication with Friends and Family: More than three times a week    Frequency of Social Gatherings with Friends and Family: Once a week  Attends Religious Services: 1 to 4 times per year    Active Member of Clubs or Organizations: Patient declined    Attends Banker Meetings: 1 to 4 times per year    Marital Status: Never married  Intimate Partner Violence: Not on file    Review of Systems  All other systems reviewed and are negative.       Objective    BP 118/84 (BP Location: Right Arm, Patient Position: Sitting, Cuff Size: Large)   Pulse 71   Temp 98.7 F (37.1 C) (Oral)   Resp 16   Ht 5\' 8"  (1.727 m)   Wt 227 lb 6.4 oz (103.1 kg)   SpO2 96%   BMI 34.58 kg/m   Physical Exam Vitals and nursing note reviewed.  Constitutional:      General: She is not in acute distress. HENT:     Head: Normocephalic and atraumatic.      Comments: Right occipital scalp region with solitary pea sized well circumscribed NT slightly mobile lesion noted.  Cardiovascular:     Rate and Rhythm: Normal rate and regular rhythm.  Pulmonary:     Effort: Pulmonary effort is normal.     Breath sounds: Normal breath sounds.  Abdominal:     Palpations: Abdomen is soft.  Neurological:     General: No focal deficit present.     Mental Status: She is alert and oriented to person, place, and time.         Assessment & Plan:   Scalp lesion   Reassurance given that is most likely benign lipoma type lesion - will monitor  Return if symptoms worsen or fail to improve.   Tommie Raymond, MD

## 2022-11-29 ENCOUNTER — Other Ambulatory Visit: Payer: Self-pay | Admitting: Family Medicine

## 2023-02-21 ENCOUNTER — Ambulatory Visit: Payer: No Typology Code available for payment source | Admitting: Family Medicine

## 2023-03-15 ENCOUNTER — Ambulatory Visit (INDEPENDENT_AMBULATORY_CARE_PROVIDER_SITE_OTHER): Payer: No Typology Code available for payment source | Admitting: Family Medicine

## 2023-03-15 VITALS — BP 124/75 | HR 84 | Temp 97.3°F | Resp 16 | Wt 230.2 lb

## 2023-03-15 DIAGNOSIS — L989 Disorder of the skin and subcutaneous tissue, unspecified: Secondary | ICD-10-CM

## 2023-03-15 NOTE — Progress Notes (Signed)
 Patient is here for their 3 month follow-up Patient has no concerns today Care gaps have been discussed with patient

## 2023-03-18 ENCOUNTER — Encounter: Payer: Self-pay | Admitting: Family Medicine

## 2023-03-18 NOTE — Progress Notes (Signed)
 Established Patient Office Visit  Subjective    Patient ID: Brittany Baxter, female    DOB: Nov 11, 1980  Age: 43 y.o. MRN: [id number]  CC: No chief complaint on file.   HPI [name] presents for follow up of scalp lesion. She reports no change.   Outpatient Encounter Medications as of 03/15/2023  Medication Sig   albuterol  (VENTOLIN  HFA) 108 (90 Base) MCG/ACT inhaler INHALE 2 PUFFS INTO THE LUNGS EVERY 6 HOURS AS NEEDED   buPROPion (WELLBUTRIN) 100 MG tablet    Cetirizine HCl (ZYRTEC PO) Take by mouth.   sertraline (ZOLOFT) 100 MG tablet Take 100 mg by mouth daily.   Vitamin D , Ergocalciferol , (DRISDOL ) 1.25 MG (50000 UNIT) CAPS capsule Take 1 capsule (50,000 Units total) by mouth every 7 (seven) days. (Patient not taking: Reported on 03/15/2023)   No facility-administered encounter medications on file as of 03/15/2023.    Past Medical History:  Diagnosis Date   Allergy    Anxiety    Arthritis    Asthma    Depression     Past Surgical History:  Procedure Laterality Date   ARM SKIN LESION BIOPSY / EXCISION      Family History  Problem Relation Age of Onset   Cancer Mother    Diabetes Brother     Social History   Socioeconomic History   Marital status: Single    Spouse name: Not on file   Number of children: Not on file   Years of education: Not on file   Highest education level: Some college, no degree  Occupational History   Not on file  Tobacco Use   Smoking status: Former    Current packs/day: 0.00    Types: Cigarettes    Quit date: 05/26/2014    Years since quitting: 8.8   Smokeless tobacco: Never  Substance and Sexual Activity   Alcohol use: Yes    Comment: weekly   Drug use: Yes    Types: Marijuana   Sexual activity: Yes  Other Topics Concern   Not on file  Social History Narrative   Not on file   Social Drivers of Health   Financial Resource Strain: Patient Declined (11/19/2022)   Overall Financial Resource Strain (CARDIA)    Difficulty  of Paying Living Expenses: Patient declined  Recent Concern: Financial Resource Strain - Medium Risk (09/27/2022)   Overall Financial Resource Strain (CARDIA)    Difficulty of Paying Living Expenses: Somewhat hard  Food Insecurity: Patient Declined (11/19/2022)   Hunger Vital Sign    Worried About Running Out of Food in the Last Year: Patient declined    Ran Out of Food in the Last Year: Patient declined  Transportation Needs: No Transportation Needs (11/19/2022)   PRAPARE - Administrator, Civil Service (Medical): No    Lack of Transportation (Non-Medical): No  Physical Activity: Insufficiently Active (11/19/2022)   Exercise Vital Sign    Days of Exercise per Week: 2 days    Minutes of Exercise per Session: 30 min  Stress: Stress Concern Present (11/19/2022)   [employer] of Occupational Health - Occupational Stress Questionnaire    Feeling of Stress : To some extent  Social Connections: Moderately Integrated (11/19/2022)   Social Connection and Isolation Panel [NHANES]    Frequency of Communication with Friends and Family: More than three times a week    Frequency of Social Gatherings with Friends and Family: Once a week    Attends Religious Services: 1 to 4 times  per year    Active Member of Clubs or Organizations: Patient declined    Attends Banker Meetings: 1 to 4 times per year    Marital Status: Never married  Intimate Partner Violence: Not on file    Review of Systems  All other systems reviewed and are negative.       Objective    BP 124/75   Pulse 84   Temp (!) 97.3 F (36.3 C) (Temporal)   Resp 16   Wt 230 lb 3.2 oz (104.4 kg)   SpO2 94%   BMI 35.00 kg/m   Physical Exam Vitals and nursing note reviewed.  Constitutional:      General: She is not in acute distress. HENT:     Head: Normocephalic and atraumatic.     Comments: Right occipital scalp region with solitary pea sized well circumscribed NT slightly mobile lesion  noted.  Cardiovascular:     Rate and Rhythm: Normal rate and regular rhythm.  Pulmonary:     Effort: Pulmonary effort is normal.     Breath sounds: Normal breath sounds.  Abdominal:     Palpations: Abdomen is soft.  Neurological:     General: No focal deficit present.     Mental Status: She is alert and oriented to person, place, and time.         Assessment & Plan:   Scalp lesion   Appears to be no change. Patient is ok with observation at this time.   Return if symptoms worsen or fail to improve.   Tanda Raguel SQUIBB, MD

## 2023-09-28 ENCOUNTER — Ambulatory Visit (INDEPENDENT_AMBULATORY_CARE_PROVIDER_SITE_OTHER): Payer: No Typology Code available for payment source | Admitting: Family Medicine

## 2023-09-28 ENCOUNTER — Other Ambulatory Visit: Payer: Self-pay | Admitting: Family Medicine

## 2023-09-28 ENCOUNTER — Encounter: Payer: Self-pay | Admitting: Family Medicine

## 2023-09-28 VITALS — BP 121/86 | HR 75 | Ht 68.0 in | Wt 239.2 lb

## 2023-09-28 DIAGNOSIS — Z1329 Encounter for screening for other suspected endocrine disorder: Secondary | ICD-10-CM | POA: Diagnosis not present

## 2023-09-28 DIAGNOSIS — Z1231 Encounter for screening mammogram for malignant neoplasm of breast: Secondary | ICD-10-CM

## 2023-09-28 DIAGNOSIS — Z130 Encounter for screening for diseases of the blood and blood-forming organs and certain disorders involving the immune mechanism: Secondary | ICD-10-CM

## 2023-09-28 DIAGNOSIS — Z13220 Encounter for screening for lipoid disorders: Secondary | ICD-10-CM | POA: Diagnosis not present

## 2023-09-28 DIAGNOSIS — Z13228 Encounter for screening for other metabolic disorders: Secondary | ICD-10-CM

## 2023-09-28 DIAGNOSIS — Z0000 Encounter for general adult medical examination without abnormal findings: Secondary | ICD-10-CM | POA: Diagnosis not present

## 2023-09-28 MED ORDER — ALBUTEROL SULFATE HFA 108 (90 BASE) MCG/ACT IN AERS: 2.0000 | INHALATION_SPRAY | Freq: Four times a day (QID) | RESPIRATORY_TRACT | 0 refills | Status: DC | PRN

## 2023-09-28 NOTE — Progress Notes (Signed)
 Established Patient Office Visit  Subjective    Patient ID: Brittany Baxter, female    DOB: 1980/09/27  Age: 43 y.o. MRN: [id number]  CC:  Chief Complaint  Patient presents with   Annual Exam    HPI Brittany Baxter presents for routine annual exam. Patient denies acute complaints.   Outpatient Encounter Medications as of 09/28/2023  Medication Sig   Cetirizine HCl (ZYRTEC PO) Take by mouth.   [DISCONTINUED] albuterol  (VENTOLIN  HFA) 108 (90 Base) MCG/ACT inhaler INHALE 2 PUFFS INTO THE LUNGS EVERY 6 HOURS AS NEEDED   albuterol  (VENTOLIN  HFA) 108 (90 Base) MCG/ACT inhaler Inhale 2 puffs into the lungs every 6 (six) hours as needed.   buPROPion (WELLBUTRIN) 100 MG tablet    sertraline (ZOLOFT) 100 MG tablet Take 100 mg by mouth daily.   Vitamin D , Ergocalciferol , (DRISDOL ) 1.25 MG (50000 UNIT) CAPS capsule Take 1 capsule (50,000 Units total) by mouth every 7 (seven) days. (Patient not taking: Reported on 03/15/2023)   No facility-administered encounter medications on file as of 09/28/2023.    Past Medical History:  Diagnosis Date   Allergy    Anxiety    Arthritis    Asthma    Depression     Past Surgical History:  Procedure Laterality Date   ARM SKIN LESION BIOPSY / EXCISION      Family History  Problem Relation Age of Onset   Cancer Mother    Diabetes Brother     Social History   Socioeconomic History   Marital status: Single    Spouse name: Not on file   Number of children: Not on file   Years of education: Not on file   Highest education level: Some college, no degree  Occupational History   Not on file  Tobacco Use   Smoking status: Former    Current packs/day: 0.00    Types: Cigarettes    Quit date: 05/26/2014    Years since quitting: 9.3   Smokeless tobacco: Never  Substance and Sexual Activity   Alcohol use: Yes    Comment: weekly   Drug use: Yes    Types: Marijuana   Sexual activity: Yes  Other Topics Concern   Not on file  Social History  Narrative   Not on file   Social Drivers of Health   Financial Resource Strain: Medium Risk (09/27/2023)   Overall Financial Resource Strain (CARDIA)    Difficulty of Paying Living Expenses: Somewhat hard  Food Insecurity: Food Insecurity Present (09/27/2023)   Hunger Vital Sign    Worried About Running Out of Food in the Last Year: Sometimes true    Ran Out of Food in the Last Year: Patient declined  Transportation Needs: No Transportation Needs (09/27/2023)   PRAPARE - Administrator, Civil Service (Medical): No    Lack of Transportation (Non-Medical): No  Physical Activity: Insufficiently Active (09/27/2023)   Exercise Vital Sign    Days of Exercise per Week: 1 day    Minutes of Exercise per Session: 30 min  Stress: No Stress Concern Present (09/27/2023)   [employer] of Occupational Health - Occupational Stress Questionnaire    Feeling of Stress: Not at all  Social Connections: Unknown (09/27/2023)   Social Connection and Isolation Panel    Frequency of Communication with Friends and Family: Three times a week    Frequency of Social Gatherings with Friends and Family: Once a week    Attends Religious Services: Never    Active Member  of Clubs or Organizations: Patient declined    Attends Banker Meetings: Not on file    Marital Status: Patient declined  Intimate Partner Violence: Not on file    Review of Systems  All other systems reviewed and are negative.       Objective    BP 121/86   Pulse 75   Ht 5' 8 (1.727 m)   Wt 239 lb 3.2 oz (108.5 kg)   LMP 09/18/2023 (Exact Date)   SpO2 97%   BMI 36.37 kg/m   Physical Exam Vitals and nursing note reviewed.  Constitutional:      General: She is not in acute distress. HENT:     Head: Normocephalic and atraumatic.     Right Ear: Tympanic membrane, ear canal and external ear normal.     Left Ear: Tympanic membrane, ear canal and external ear normal.     Nose: Nose normal.      Mouth/Throat:     Mouth: Mucous membranes are moist.     Pharynx: Oropharynx is clear.  Eyes:     Conjunctiva/sclera: Conjunctivae normal.     Pupils: Pupils are equal, round, and reactive to light.  Neck:     Thyroid: No thyromegaly.  Cardiovascular:     Rate and Rhythm: Normal rate and regular rhythm.     Heart sounds: Normal heart sounds. No murmur heard. Pulmonary:     Effort: Pulmonary effort is normal. No respiratory distress.     Breath sounds: Normal breath sounds.  Abdominal:     General: There is no distension.     Palpations: Abdomen is soft. There is no mass.     Tenderness: There is no abdominal tenderness.  Musculoskeletal:        General: Normal range of motion.     Cervical back: Normal range of motion and neck supple.  Skin:    General: Skin is warm and dry.  Neurological:     General: No focal deficit present.     Mental Status: She is alert and oriented to person, place, and time.  Psychiatric:        Mood and Affect: Mood normal.        Behavior: Behavior normal.         Assessment & Plan:   Annual physical exam -     CMP14+EGFR  Screening for deficiency anemia -     CBC with Differential/Platelet  Screening for lipid disorders -     Lipid panel  Screening for endocrine/metabolic/immunity disorders -     VITAMIN D  25 Hydroxy (Vit-D Deficiency, Fractures) -     Hemoglobin A1c -     TSH  Other orders -     Albuterol  Sulfate HFA; Inhale 2 puffs into the lungs every 6 (six) hours as needed.  Dispense: 8.5 g; Refill: 0     No follow-ups on file.   Brittany Raguel SQUIBB, MD

## 2023-09-29 LAB — CMP14+EGFR
ALT: 19 IU/L (ref 0–32)
AST: 17 IU/L (ref 0–40)
Albumin: 4.1 g/dL (ref 3.9–4.9)
Alkaline Phosphatase: 53 IU/L (ref 44–121)
BUN/Creatinine Ratio: 9 (ref 9–23)
BUN: 6 mg/dL (ref 6–24)
Bilirubin Total: 0.4 mg/dL (ref 0.0–1.2)
CO2: 22 mmol/L (ref 20–29)
Calcium: 8.7 mg/dL (ref 8.7–10.2)
Chloride: 105 mmol/L (ref 96–106)
Creatinine, Ser: 0.64 mg/dL (ref 0.57–1.00)
Globulin, Total: 2.8 g/dL (ref 1.5–4.5)
Glucose: 83 mg/dL (ref 70–99)
Potassium: 4.3 mmol/L (ref 3.5–5.2)
Sodium: 141 mmol/L (ref 134–144)
Total Protein: 6.9 g/dL (ref 6.0–8.5)
eGFR: 112 mL/min/1.73 (ref >59)

## 2023-09-29 LAB — CBC WITH DIFFERENTIAL/PLATELET
Basophils Absolute: 0 x10E3/uL (ref 0.0–0.2)
Basos: 1 %
EOS (ABSOLUTE): 0.2 x10E3/uL (ref 0.0–0.4)
Eos: 3 %
Hematocrit: 41.1 % (ref 34.0–46.6)
Hemoglobin: 13 g/dL (ref 11.1–15.9)
Immature Grans (Abs): 0 x10E3/uL (ref 0.0–0.1)
Immature Granulocytes: 0 %
Lymphocytes Absolute: 2.5 x10E3/uL (ref 0.7–3.1)
Lymphs: 45 %
MCH: 31.9 pg (ref 26.6–33.0)
MCHC: 31.6 g/dL (ref 31.5–35.7)
MCV: 101 fL — ABNORMAL HIGH (ref 79–97)
Monocytes Absolute: 0.4 x10E3/uL (ref 0.1–0.9)
Monocytes: 7 %
Neutrophils Absolute: 2.4 x10E3/uL (ref 1.4–7.0)
Neutrophils: 44 %
Platelets: 272 x10E3/uL (ref 150–450)
RBC: 4.07 x10E6/uL (ref 3.77–5.28)
RDW: 12.2 % (ref 11.7–15.4)
WBC: 5.5 x10E3/uL (ref 3.4–10.8)

## 2023-09-29 LAB — LIPID PANEL
Chol/HDL Ratio: 3.6 ratio (ref 0.0–4.4)
Cholesterol, Total: 168 mg/dL (ref 100–199)
HDL: 47 mg/dL (ref >39)
LDL Chol Calc (NIH): 97 mg/dL (ref 0–99)
Triglycerides: 138 mg/dL (ref 0–149)
VLDL Cholesterol Cal: 24 mg/dL (ref 5–40)

## 2023-09-29 LAB — TSH: TSH: 1.69 u[IU]/mL (ref 0.450–4.500)

## 2023-09-29 LAB — HEMOGLOBIN A1C
Est. average glucose Bld gHb Est-mCnc: 108 mg/dL
Hgb A1c MFr Bld: 5.4 % (ref 4.8–5.6)

## 2023-09-29 LAB — VITAMIN D 25 HYDROXY (VIT D DEFICIENCY, FRACTURES): Vit D, 25-Hydroxy: 17.3 ng/mL — ABNORMAL LOW (ref 30.0–100.0)

## 2023-09-30 ENCOUNTER — Ambulatory Visit: Payer: Self-pay | Admitting: Family Medicine

## 2023-09-30 MED ORDER — VITAMIN D (ERGOCALCIFEROL) 1.25 MG (50000 UNIT) PO CAPS: 50000.0000 [IU] | ORAL_CAPSULE | ORAL | 0 refills | Status: AC

## 2023-10-12 ENCOUNTER — Ambulatory Visit
Admission: RE | Admit: 2023-10-12 | Discharge: 2023-10-12 | Disposition: A | Discharge status: Home / Self Care | Source: Ambulatory Visit

## 2023-10-12 DIAGNOSIS — Z1231 Encounter for screening mammogram for malignant neoplasm of breast: Secondary | ICD-10-CM

## 2023-10-18 ENCOUNTER — Ambulatory Visit: Payer: Self-pay | Admitting: Family Medicine

## 2023-11-30 ENCOUNTER — Other Ambulatory Visit: Payer: Self-pay | Admitting: Family Medicine

## 2024-02-29 ENCOUNTER — Other Ambulatory Visit: Payer: Self-pay | Admitting: Family Medicine
